# Patient Record
Sex: Female | Born: 1945 | Race: White | Hispanic: No | Marital: Married | Smoking: Former smoker
Health system: Southern US, Community
[De-identification: ages and names within clinical notes are randomized; demographics above are authoritative.]

## PROBLEM LIST (undated history)

## (undated) DIAGNOSIS — C4491 Basal cell carcinoma of skin, unspecified: Secondary | ICD-10-CM

## (undated) DIAGNOSIS — R519 Headache, unspecified: Secondary | ICD-10-CM

## (undated) DIAGNOSIS — R51 Headache: Secondary | ICD-10-CM

## (undated) DIAGNOSIS — K589 Irritable bowel syndrome without diarrhea: Secondary | ICD-10-CM

## (undated) DIAGNOSIS — K635 Polyp of colon: Secondary | ICD-10-CM

## (undated) DIAGNOSIS — T7840XA Allergy, unspecified, initial encounter: Secondary | ICD-10-CM

## (undated) DIAGNOSIS — C4492 Squamous cell carcinoma of skin, unspecified: Secondary | ICD-10-CM

## (undated) DIAGNOSIS — M1990 Unspecified osteoarthritis, unspecified site: Secondary | ICD-10-CM

## (undated) HISTORY — DX: Headache, unspecified: R51.9

## (undated) HISTORY — PX: CATARACT EXTRACTION: SUR2

## (undated) HISTORY — DX: Unspecified osteoarthritis, unspecified site: M19.90

## (undated) HISTORY — DX: Squamous cell carcinoma of skin, unspecified: C44.92

## (undated) HISTORY — DX: Headache: R51

## (undated) HISTORY — DX: Irritable bowel syndrome, unspecified: K58.9

## (undated) HISTORY — DX: Allergy, unspecified, initial encounter: T78.40XA

## (undated) HISTORY — PX: SKIN BIOPSY: SHX1

## (undated) HISTORY — DX: Basal cell carcinoma of skin, unspecified: C44.91

## (undated) HISTORY — DX: Polyp of colon: K63.5

---

## 1975-10-31 HISTORY — PX: TONSILLECTOMY AND ADENOIDECTOMY: SHX28

## 2010-10-30 HISTORY — PX: COLONOSCOPY: SHX174

## 2013-04-23 ENCOUNTER — Ambulatory Visit: Payer: Self-pay | Admitting: Internal Medicine

## 2014-03-27 ENCOUNTER — Ambulatory Visit: Payer: Self-pay | Admitting: Internal Medicine

## 2014-04-03 ENCOUNTER — Ambulatory Visit (INDEPENDENT_AMBULATORY_CARE_PROVIDER_SITE_OTHER): Payer: Medicare Other | Admitting: Internal Medicine

## 2014-04-03 ENCOUNTER — Encounter: Payer: Self-pay | Admitting: Internal Medicine

## 2014-04-03 VITALS — BP 108/76 | HR 85 | Temp 97.9°F | Ht 63.75 in | Wt 154.0 lb

## 2014-04-03 DIAGNOSIS — Z85828 Personal history of other malignant neoplasm of skin: Secondary | ICD-10-CM

## 2014-04-03 DIAGNOSIS — K589 Irritable bowel syndrome without diarrhea: Secondary | ICD-10-CM

## 2014-04-03 DIAGNOSIS — J302 Other seasonal allergic rhinitis: Secondary | ICD-10-CM | POA: Insufficient documentation

## 2014-04-03 DIAGNOSIS — Z0000 Encounter for general adult medical examination without abnormal findings: Secondary | ICD-10-CM

## 2014-04-03 NOTE — Assessment & Plan Note (Signed)
Mostly constipation Adheres to a high fiber diet

## 2014-04-03 NOTE — Progress Notes (Signed)
HPI:  Pt presents to the clinic today to establish care. She is transferring care from Dr. Keturah Barre office. She has no concerns other than she is due for her annual wellness exam today. She would like referral to another dermatologist. She did go to Dr. Nicole Kindred at [facility] prior.  Past Medical History  Diagnosis Date  . Allergy   . Arthritis   . IBS (irritable bowel syndrome)   . Colon polyps   . Frequent headaches   . Skin cancer, basal cell   . Cancer, skin, squamous cell     Current Outpatient Prescriptions  Medication Sig Dispense Refill  . Ascorbic Acid (VITAMIN C) 1000 MG tablet Take 1,000 mg by mouth daily.      Marland Kitchen aspirin 81 MG tablet Take 81 mg by mouth daily.      . Aspirin-Acetaminophen-Caffeine (EXCEDRIN PO) Take 1 capsule by mouth as needed.      . Biotin 5000 MCG TABS Take 1 tablet by mouth.      . Calcium Carb-Cholecalciferol (CALCIUM 600+D3) 600-800 MG-UNIT TABS Take 1 capsule by mouth daily.      . Coconut Oil 1000 MG CAPS Take 1 capsule by mouth daily.      . Inulin (FIBER CHOICE PO) Take 1 each by mouth as needed.      . loratadine (CLARITIN) 10 MG tablet Take 10 mg by mouth daily.      . Multiple Vitamins-Minerals (WOMENS MULTIVITAMIN PLUS PO) Take 1 capsule by mouth daily.      . Omega-3 Fatty Acids (FISH OIL) 1000 MG CAPS Take 1 capsule by mouth daily.      . Probiotic Product (PROBIOTIC DAILY PO) Take 1 capsule by mouth as needed.       No current facility-administered medications for this visit.    No Known Allergies  Family History  Problem Relation Age of Onset  . Arthritis Mother   . Hyperlipidemia Mother   . Hypertension Mother   . Mental illness Sister   . Cancer Maternal Grandmother     Ovarian and Uterine  . Diabetes Maternal Grandmother   . Mental illness Maternal Grandfather     History   Social History  . Marital Status: Married    Spouse Name: N/A    Number of Children: N/A  . Years of Education: N/A   Occupational  History  . Not on file.   Social History Main Topics  . Smoking status: Never Smoker   . Smokeless tobacco: Never Used  . Alcohol Use: 0.6 oz/week    1 Glasses of wine per week     Comment: rare--wine  . Drug Use: No  . Sexual Activity: No   Other Topics Concern  . Not on file   Social History Narrative  . No narrative on file    Hospitiliaztions: None  Health Maintenance:  Flu: 07/2013 Tetanus: 2014 Pneumovax:07/2013 Zostovax: 2013 Pap Smear: 08/2013 Mammogram: 07/2013 Bone Density: 06/2012 Colonoscopy: 2013 (Unsure of her GI doctor's name) Eye Doctor: yearly [facility]) Dentist: biannually (Dr. Daily) Dermatologist: 08/2013 (Dr. [facility])    I have personally reviewed and have noted:  1. The patient's medical and social history 2. Their use of alcohol, tobacco or illicit drugs 3. Their current medications and supplements 4. The patient's functional ability including ADL's, fall  risks, home safety risks and  hearing or visual  impairment. 5. Diet and physical activities 6. Evidence for depression or mood disorder  Subjective:  Review of Systems:   Constitutional: Pt reports sinus headache. Denies fever, malaise, fatigue, or abrupt weight changes.  HEENT: Pt reports nasal congestion. Denies eye pain, eye redness, ear pain, ringing in the ears, wax buildup, runny nose, bloody nose, or sore throat. Respiratory: Denies difficulty breathing, shortness of breath, cough or sputum production.   Cardiovascular: Denies chest pain, chest tightness, palpitations or swelling in the hands or feet.  Gastrointestinal: Pt reports constipation. Denies abdominal pain, bloating, diarrhea or blood in the stool.  GU: Denies urgency, frequency, pain with urination, burning sensation, blood in urine, odor or discharge. Musculoskeletal: Denies decrease in range of motion, difficulty with gait, muscle pain or joint pain and swelling.  Skin: Denies  redness, rashes, lesions or ulcercations.  Neurological: Denies dizziness, difficulty with memory, difficulty with speech or problems with balance and coordination.   No other specific complaints in a complete review of systems (except as listed in HPI above).  Objective:  PE:   BP 108/76  Pulse 85  Temp(Src) 97.9 F (36.6 C) (Oral)  Ht 5' 3.75" (1.619 m)  Wt 154 lb (69.854 kg)  BMI 26.65 kg/m2  SpO2 97% Wt Readings from Last 3 Encounters:  04/03/14 154 lb (69.854 kg)    General: Appears her stated age, well developed, well nourished in NAD. HEENT: Head: normal shape and size, maxillary sinus tenderness noted; Eyes: sclera white, no icterus, conjunctiva pink, PERRLA and EOMs intact; Ears: Tm's gray and intact, normal light reflex; Nose: mucosa boggy and moist, septum midline; Throat/Mouth: Teeth present, mucosa pink and moist, + PND, no exudate, lesions or ulcerations noted.   Cardiovascular: Normal rate and rhythm. S1,S2 noted.  No murmur, rubs or gallops noted. No JVD or BLE edema. No carotid bruits noted. Pulmonary/Chest: Normal effort and positive vesicular breath sounds. No respiratory distress. No wheezes, rales or ronchi noted.  Abdomen: Soft and nontender. Normal bowel sounds, no bruits noted. No distention or masses noted. Liver, spleen and kidneys non palpable.   Assessment and Plan:   Medicare Annual Wellness Visit:  Diet: Heart healthy- eats lots of fruits and veggies, high fiber Physical activity: Active 3-5 days per week (walking/gym) Depression/mood screen: Negative Hearing: Intact to whispered voice Visual acuity: Grossly normal, performs annual eye exam  ADLs: Capable Fall risk: None Home safety: Good Cognitive evaluation: Intact to orientation, naming, recall and repetition EOL planning: Living will, full code/ I agree (She will provide [place] with a copy of the living will)  Preventative Medicine:  Pap Smear: Due 2019 Mammogram: Due 08/2014 Bone Density:  Due 2018 Colonoscopy: Due 2020 Eye Doctor:  Scheduled 04/13/14 Dentist:  Scheduled 04/13/2014 Dermatologist: Due 08/2014  Next appointment: 08/2014 with me for annual physical and blood work

## 2014-04-03 NOTE — Assessment & Plan Note (Signed)
Will refer to derm per pt request

## 2014-04-03 NOTE — Progress Notes (Signed)
Pre visit review using our clinic review tool, if applicable. No additional management support is needed unless otherwise documented below in the visit note. 

## 2014-04-03 NOTE — Assessment & Plan Note (Signed)
On claritin daily Advised adding Nasonex

## 2014-04-03 NOTE — Patient Instructions (Addendum)
Pap Smear: Due 2019 Mammogram: 08/2014 Bone Density: Due 2018 Colonoscopy: Due 2020 Eye Doctor: 04/13/14 Dentist: 03/2014 Dermatologist: Due 08/2014

## 2014-09-14 ENCOUNTER — Ambulatory Visit (INDEPENDENT_AMBULATORY_CARE_PROVIDER_SITE_OTHER): Payer: Medicare Other | Admitting: Internal Medicine

## 2014-09-14 ENCOUNTER — Encounter: Payer: Self-pay | Admitting: Internal Medicine

## 2014-09-14 VITALS — BP 102/60 | HR 69 | Temp 97.9°F | Ht 63.75 in | Wt 157.0 lb

## 2014-09-14 DIAGNOSIS — Z0000 Encounter for general adult medical examination without abnormal findings: Secondary | ICD-10-CM

## 2014-09-14 DIAGNOSIS — Z13820 Encounter for screening for osteoporosis: Secondary | ICD-10-CM

## 2014-09-14 DIAGNOSIS — E2839 Other primary ovarian failure: Secondary | ICD-10-CM | POA: Diagnosis not present

## 2014-09-14 DIAGNOSIS — Z79899 Other long term (current) drug therapy: Secondary | ICD-10-CM | POA: Diagnosis not present

## 2014-09-14 LAB — COMPREHENSIVE METABOLIC PANEL
ALBUMIN: 4.5 g/dL (ref 3.5–5.2)
ALK PHOS: 61 U/L (ref 39–117)
ALT: 18 U/L (ref 0–35)
AST: 23 U/L (ref 0–37)
BILIRUBIN TOTAL: 0.7 mg/dL (ref 0.2–1.2)
BUN: 17 mg/dL (ref 6–23)
CO2: 24 mEq/L (ref 19–32)
Calcium: 9.5 mg/dL (ref 8.4–10.5)
Chloride: 108 mEq/L (ref 96–112)
Creatinine, Ser: 0.8 mg/dL (ref 0.4–1.2)
GFR: 75.75 mL/min (ref >60.00)
GLUCOSE: 94 mg/dL (ref 70–99)
POTASSIUM: 4.2 meq/L (ref 3.5–5.1)
SODIUM: 142 meq/L (ref 135–145)
TOTAL PROTEIN: 7 g/dL (ref 6.0–8.3)

## 2014-09-14 LAB — LIPID PANEL
CHOLESTEROL: 177 mg/dL (ref 0–200)
HDL: 51.5 mg/dL (ref >39.00)
LDL CALC: 113 mg/dL — AB (ref 0–99)
NonHDL: 125.5
Total CHOL/HDL Ratio: 3
Triglycerides: 62 mg/dL (ref 0.0–149.0)
VLDL: 12.4 mg/dL (ref 0.0–40.0)

## 2014-09-14 LAB — CBC
HCT: 41 % (ref 36.0–46.0)
Hemoglobin: 13.7 g/dL (ref 12.0–15.0)
MCHC: 33.4 g/dL (ref 30.0–36.0)
MCV: 90.5 fl (ref 78.0–100.0)
Platelets: 248 10*3/uL (ref 150.0–400.0)
RBC: 4.53 Mil/uL (ref 3.87–5.11)
RDW: 13.1 % (ref 11.5–15.5)
WBC: 5.6 10*3/uL (ref 4.0–10.5)

## 2014-09-14 LAB — VITAMIN D 25 HYDROXY (VIT D DEFICIENCY, FRACTURES): VITD: 39.23 ng/mL (ref 30.00–100.00)

## 2014-09-14 LAB — TSH: TSH: 1.89 u[IU]/mL (ref 0.35–4.50)

## 2014-09-14 NOTE — Patient Instructions (Signed)

## 2014-09-14 NOTE — Progress Notes (Addendum)
Subjective:    Patient ID: Kristine Perry, female    DOB: 1946/07/24, 68 y.o.   MRN: [id number]  HPI  Pt presents to the clinic today for her annual exam. She had her medicare wellness visit 03/2014.  Flu: yearly, will get this week at the pharmacy Tetanus: 2014 Pneumonia Vaccine: 2013 Prevnar Zostovax: 2013 Mammogram: 07/2013 Pap Smear: 2014 Bone Density: 2013 Colon Screening: Due 2020 Vision Screening: Yearly Dentist: 03/2014  Review of Systems  Past Medical History  Diagnosis Date  . Allergy   . Arthritis   . IBS (irritable bowel syndrome)   . Colon polyps   . Frequent headaches   . Skin cancer, basal cell   . Cancer, skin, squamous cell     Current Outpatient Prescriptions  Medication Sig Dispense Refill  . Ascorbic Acid (VITAMIN C) 1000 MG tablet Take 1,000 mg by mouth daily.    Marland Kitchen aspirin 81 MG tablet Take 81 mg by mouth daily.    . Aspirin-Acetaminophen-Caffeine (EXCEDRIN PO) Take 1 capsule by mouth as needed.    . Biotin 5000 MCG TABS Take 1 tablet by mouth.    . Calcium Carb-Cholecalciferol (CALCIUM 600+D3) 600-800 MG-UNIT TABS Take 1 capsule by mouth daily.    . Coconut Oil 1000 MG CAPS Take 1 capsule by mouth daily.    . Inulin (FIBER CHOICE PO) Take 1 each by mouth as needed.    . loratadine (CLARITIN) 10 MG tablet Take 10 mg by mouth daily.    . Multiple Vitamins-Minerals (WOMENS MULTIVITAMIN PLUS PO) Take 1 capsule by mouth daily.    . Omega-3 Fatty Acids (FISH OIL) 1000 MG CAPS Take 1 capsule by mouth daily.    . Probiotic Product (PROBIOTIC DAILY PO) Take 1 capsule by mouth as needed.     No current facility-administered medications for this visit.    No Known Allergies  Family History  Problem Relation Age of Onset  . Arthritis Mother   . Hyperlipidemia Mother   . Hypertension Mother   . Mental illness Sister   . Cancer Maternal Grandmother     Ovarian and Uterine  . Diabetes Maternal Grandmother   . Mental illness Maternal Grandfather      History   Social History  . Marital Status: Married    Spouse Name: N/A    Number of Children: N/A  . Years of Education: N/A   Occupational History  . Not on file.   Social History Main Topics  . Smoking status: Never Smoker   . Smokeless tobacco: Never Used  . Alcohol Use: 0.6 oz/week    1 Glasses of wine per week     Comment: rare--wine  . Drug Use: No  . Sexual Activity: No   Other Topics Concern  . Not on file   Social History Narrative     Constitutional: Pt reports weight gain. Denies fever, malaise, fatigue, headache.  HEENT: Pt reports dry eyes. Denies eye pain, eye redness, ear pain, ringing in the ears, wax buildup, runny nose, nasal congestion, bloody nose, or sore throat. Respiratory: Denies difficulty breathing, shortness of breath, cough or sputum production.   Cardiovascular: Denies chest pain, chest tightness, palpitations or swelling in the hands or feet.  Gastrointestinal: Pt reports flatulence. Denies abdominal pain, bloating, constipation, diarrhea or blood in the stool.  GU: Denies urgency, frequency, pain with urination, burning sensation, blood in urine, odor or discharge. Musculoskeletal: Pt reports joint pain and swelling. Denies decrease in range of motion, difficulty with  gait, muscle pain.  Skin: Pt reports dry skin. Denies redness, rashes, lesions or ulcercations.  Neurological: Denies dizziness, difficulty with memory, difficulty with speech or problems with balance and coordination.   No other specific complaints in a complete review of systems (except as listed in HPI above).     Objective:   Physical Exam   BP 102/60 mmHg  Pulse 69  Temp(Src) 97.9 F (36.6 C) (Oral)  Ht 5' 3.75" (1.619 m)  Wt 157 lb (71.215 kg)  BMI 27.17 kg/m2  SpO2 98% Wt Readings from Last 3 Encounters:  09/14/14 157 lb (71.215 kg)  04/03/14 154 lb (69.854 kg)    General: Appears her stated age, well developed, well nourished in NAD. She has only  gained 3 lbs. Skin: Warm, dry and intact. No rashes, lesions or ulcerations noted. HEENT: Head: normal shape and size; Eyes: sclera white, no icterus, conjunctiva pink, PERRLA and EOMs intact; Ears: Tm's gray and intact, normal light reflex; Nose: mucosa pink and moist, septum midline; Throat/Mouth: Teeth present, mucosa pink and moist, no exudate, lesions or ulcerations noted.  Neck:  Neck supple, trachea midline. No masses, lumps or thyromegaly present.  Cardiovascular: Normal rate and rhythm. S1,S2 noted.  No murmur, rubs or gallops noted. No JVD or BLE edema. No carotid bruits noted. Pulmonary/Chest: Normal effort and positive vesicular breath sounds. No respiratory distress. No wheezes, rales or ronchi noted.  Abdomen: Soft and nontender. Normal bowel sounds, no bruits noted. No distention or masses noted. Liver, spleen and kidneys non palpable. Musculoskeletal: Normal range of motion. Herbdens nodes noted of hands bilaterally. Grip strength 5/5 bilaterally. No difficulty with gait.  Neurological: Alert and oriented. Cranial nerves II-XII grossly intact.  Psychiatric: Mood and affect normal. Behavior is normal. Judgment and thought content normal.        Assessment & Plan:   Preventative Health Maintenance:  Will check CBC, CMET, Lipid profile and TSH today She will call [place] to schedule her mammogram- I gave her the phone number Will order a Bone Denisty for [facility] Will get records from previous PCP to see which pneumonia vaccine she had previously All other HM UTD  RTC in 6 months for annual wellness exam

## 2014-09-14 NOTE — Addendum Note (Signed)
Addended by: Jearld Fenton on: 09/14/2014 08:00 PM   Modules accepted: Miquel Dunn

## 2014-09-14 NOTE — Progress Notes (Signed)
Pre visit review using our clinic review tool, if applicable. No additional management support is needed unless otherwise documented below in the visit note. 

## 2014-09-29 ENCOUNTER — Telehealth: Payer: Self-pay | Admitting: Internal Medicine

## 2014-09-29 NOTE — Telephone Encounter (Signed)
I would try Mucinex and Delsym. If symptoms do not improve she should make an appt to be evaluated

## 2014-09-29 NOTE — Telephone Encounter (Signed)
Pt called and spoke with [place] she has head and chest congestion, non-productive cough. has tried otc med but nothing is working. wants suggestions on what she can do without having to make an appt  Please call pt with advice.

## 2014-09-30 NOTE — Telephone Encounter (Signed)
Left detailed msg on VM per HIPAA  

## 2014-10-28 ENCOUNTER — Ambulatory Visit: Payer: Self-pay | Admitting: Internal Medicine

## 2014-10-28 ENCOUNTER — Encounter: Payer: Self-pay | Admitting: Internal Medicine

## 2014-11-03 ENCOUNTER — Encounter: Payer: Self-pay | Admitting: Internal Medicine

## 2014-11-03 ENCOUNTER — Telehealth: Payer: Self-pay | Admitting: Internal Medicine

## 2014-11-03 NOTE — Telephone Encounter (Signed)
Pt cannot get her my chart to work.

## 2014-11-03 NOTE — Telephone Encounter (Signed)
Pt would like call back with MM and bone density results.  [phone]

## 2014-11-03 NOTE — Telephone Encounter (Signed)
I released the results to her mychart. Both normal.

## 2014-11-04 NOTE — Telephone Encounter (Signed)
Left detailed msg on VM per HIPAA  

## 2014-11-13 ENCOUNTER — Telehealth: Payer: Self-pay | Admitting: *Deleted

## 2014-11-13 NOTE — Telephone Encounter (Signed)
Lm on pts vm requesting a call back if wanting to schedule a flu shot 

## 2015-03-30 ENCOUNTER — Encounter: Payer: Self-pay | Admitting: Internal Medicine

## 2015-03-30 ENCOUNTER — Ambulatory Visit (INDEPENDENT_AMBULATORY_CARE_PROVIDER_SITE_OTHER): Payer: Medicare Other | Admitting: Internal Medicine

## 2015-03-30 ENCOUNTER — Telehealth: Payer: Self-pay | Admitting: Internal Medicine

## 2015-03-30 VITALS — BP 134/92 | HR 88 | Temp 98.4°F | Wt 160.0 lb

## 2015-03-30 DIAGNOSIS — F439 Reaction to severe stress, unspecified: Secondary | ICD-10-CM

## 2015-03-30 DIAGNOSIS — Z658 Other specified problems related to psychosocial circumstances: Secondary | ICD-10-CM

## 2015-03-30 DIAGNOSIS — J309 Allergic rhinitis, unspecified: Secondary | ICD-10-CM | POA: Diagnosis not present

## 2015-03-30 DIAGNOSIS — R079 Chest pain, unspecified: Secondary | ICD-10-CM | POA: Diagnosis not present

## 2015-03-30 NOTE — Progress Notes (Signed)
Subjective:    Patient ID: Kristine Perry, female    DOB: 09-Mar-1946, 69 y.o.   MRN: [id number]  HPI  Pt presents to the clinic today with c/o multiple complaints.   1- She has been having headache, facial pressure, nasal congestion and cough. This has been intermittent over the last month. She is not blowing anything out of her nose. He cough is no productive. She denies shortness of breath, fever, chills or body aches. She has taken Claritin and Sudafed intermittently during this time. She does not think it has been too effective.  2- She has been having chest pressure, intermittently over the last 2-3 weeks. She reports it feels like a burning sensation. It seems worse first thing in the morning and late in the afternoons. There isn't anything that has made it better or worse, including foods. She denies chest pain, shortness of breath, heartburn. She has had some gas and bloating but reports she is having normal bowel movements. She denies blood in her stool. She has taken TUMS with some relief. She is a little concerned that it may be stress related . She has been under a lot of stress lately. Her family is out of town and she is very concerned about their health.  3- Her BP is elevated today at 134/92. She has no history of elevated blood pressure. She reports she is very anxious right now because when she called to make the appointment, they advised her to go to the ER.  Review of Systems      Past Medical History  Diagnosis Date  . Allergy   . Arthritis   . IBS (irritable bowel syndrome)   . Colon polyps   . Frequent headaches   . Skin cancer, basal cell   . Cancer, skin, squamous cell     Current Outpatient Prescriptions  Medication Sig Dispense Refill  . aspirin 81 MG tablet Take 81 mg by mouth daily.    . Aspirin-Acetaminophen-Caffeine (EXCEDRIN PO) Take 1 capsule by mouth as needed.    . loratadine (CLARITIN) 10 MG tablet Take 10 mg by mouth daily.    . Ascorbic Acid  (VITAMIN C) 1000 MG tablet Take 1,000 mg by mouth daily.    . Biotin 5000 MCG TABS Take 1 tablet by mouth.    . Calcium Carb-Cholecalciferol (CALCIUM 600+D3) 600-800 MG-UNIT TABS Take 1 capsule by mouth daily.    . Coconut Oil 1000 MG CAPS Take 1 capsule by mouth daily.    . Multiple Vitamins-Minerals (WOMENS MULTIVITAMIN PLUS PO) Take 1 capsule by mouth daily.    . Omega-3 Fatty Acids (FISH OIL) 1000 MG CAPS Take 1 capsule by mouth daily.     No current facility-administered medications for this visit.    Allergies  Allergen Reactions  . Meloxicam Nausea Only    Family History  Problem Relation Age of Onset  . Arthritis Mother   . Hyperlipidemia Mother   . Hypertension Mother   . Mental illness Sister   . Cancer Maternal Grandmother     Ovarian and Uterine  . Diabetes Maternal Grandmother   . Mental illness Maternal Grandfather     History   Social History  . Marital Status: Married    Spouse Name: N/A  . Number of Children: N/A  . Years of Education: N/A   Occupational History  . Not on file.   Social History Main Topics  . Smoking status: Never Smoker   . Smokeless tobacco: Never Used  .  Alcohol Use: 0.6 oz/week    1 Glasses of wine per week     Comment: rare--wine  . Drug Use: No  . Sexual Activity: No   Other Topics Concern  . Not on file   Social History Narrative     Constitutional: Pt reports headache. Denies fever, malaise, fatigue, or abrupt weight changes.  HEENT: Pt reports nasal congestion. Denies eye pain, eye redness, ear pain, ringing in the ears, wax buildup, runny nose, bloody nose, or sore throat. Respiratory: Pt reports cough. Denies difficulty breathing, shortness of breath or sputum production.   Cardiovascular: Pt reports chest pain. Denies chest tightness, palpitations or swelling in the hands or feet.  Gastrointestinal: Pt reports gas and bloating. Denies abdominal pain, constipation, diarrhea or blood in the stool.  GU: Denies  urgency, frequency, pain with urination, burning sensation, blood in urine, odor or discharge.  No other specific complaints in a complete review of systems (except as listed in HPI above).  Objective:   Physical Exam    BP 134/92 mmHg  Pulse 88  Temp(Src) 98.4 F (36.9 C) (Oral)  Wt 160 lb (72.576 kg)  SpO2 98% Wt Readings from Last 3 Encounters:  03/30/15 160 lb (72.576 kg)  09/14/14 157 lb (71.215 kg)  04/03/14 154 lb (69.854 kg)    General: Appears her stated age, well developed, well nourished in NAD. HEENT: Head: normal shape and size; Eyes: sclera white, no icterus, conjunctiva pink, PERRLA and EOMs intact, no sinus tenderness notedt; Ears: Tm's gray and intact, normal light reflex; Throat/Mouth: Teeth present, + PND, mucosa pink and moist, no exudate, lesions or ulcerations noted.  Neck: No adenopathy noted.  Cardiovascular: Tachycardic with normal rhythm. S1,S2 noted.  No murmur, rubs or gallops noted.No carotid bruits noted. Pulmonary/Chest: Normal effort and positive vesicular breath sounds. No respiratory distress. No wheezes, rales or ronchi noted.  Abdomen: Soft and nontender. Normal bowel sounds, no bruits noted. No distention or masses noted. Liver, spleen and kidneys non palpable. Neurological: Alert and oriented.  Psychiatric: She seems very anxious today.  BMET    Component Value Date/Time   NA 142 09/14/2014 0854   K 4.2 09/14/2014 0854   CL 108 09/14/2014 0854   CO2 24 09/14/2014 0854   GLUCOSE 94 09/14/2014 0854   BUN 17 09/14/2014 0854   CREATININE 0.8 09/14/2014 0854   CALCIUM 9.5 09/14/2014 0854    Lipid Panel     Component Value Date/Time   CHOL 177 09/14/2014 0854   TRIG 62.0 09/14/2014 0854   HDL 51.50 09/14/2014 0854   CHOLHDL 3 09/14/2014 0854   VLDL 12.4 09/14/2014 0854   LDLCALC 113* 09/14/2014 0854    CBC    Component Value Date/Time   WBC 5.6 09/14/2014 0854   RBC 4.53 09/14/2014 0854   HGB 13.7 09/14/2014 0854   HCT 41.0  09/14/2014 0854   PLT 248.0 09/14/2014 0854   MCV 90.5 09/14/2014 0854   MCHC 33.4 09/14/2014 0854   RDW 13.1 09/14/2014 0854    Hgb A1C No results found for: HGBA1C     Assessment & Plan:   Chest pain, unspecified:  ECG: left atrial enlargement, otherwise normal Will try Prilosec 20 mg PO daily x 2 weeks She will update me in 2 weeks on how she is doing, sooner if worse OK to continue Tums as needed  Allergic Rhinitis:  Switch from Claritin to Allegra or Zyrtec  Stress:  She is not interested in treatment with therapy or  medication at this time Discussed stress relieving techniques such as deep breathing and meditation  RTC as needed or if symptoms persist or worsen

## 2015-03-30 NOTE — Progress Notes (Signed)
Pre visit review using our clinic review tool, if applicable. No additional management support is needed unless otherwise documented below in the visit note. 

## 2015-03-30 NOTE — Telephone Encounter (Signed)
TH called [place] to let us know that had a call 911 disposition for chest pain.  They reinforced the importance of calling, pt declined each time.  Please call pt to advise further. Thanks.

## 2015-03-30 NOTE — Patient Instructions (Addendum)
ECG is normal- this is likely not your heart It could be stress related, but we will hold off on any treatment for this It could also be reflux, start taking Prilosec 20 mg (this is over the counter) daily x 2 weeks Update me in 2 weeks and let me know how you are doing  Chest Pain (Nonspecific) It is often hard to give a specific diagnosis for the cause of chest pain. There is always a chance that your pain could be related to something serious, such as a heart attack or a blood clot in the lungs. You need to follow up with your health care provider for further evaluation. CAUSES   Heartburn.  Pneumonia or bronchitis.  Anxiety or stress.  Inflammation around your heart (pericarditis) or lung (pleuritis or pleurisy).  A blood clot in the lung.  A collapsed lung (pneumothorax). It can develop suddenly on its own (spontaneous pneumothorax) or from trauma to the chest.  Shingles infection (herpes zoster virus). The chest wall is composed of bones, muscles, and cartilage. Any of these can be the source of the pain.  The bones can be bruised by injury.  The muscles or cartilage can be strained by coughing or overwork.  The cartilage can be affected by inflammation and become sore (costochondritis). DIAGNOSIS  Lab tests or other studies may be needed to find the cause of your pain. Your health care provider may have you take a test called an ambulatory electrocardiogram (ECG). An ECG records your heartbeat patterns over a 24-hour period. You may also have other tests, such as:  Transthoracic echocardiogram (TTE). During echocardiography, sound waves are used to evaluate how blood flows through your heart.  Transesophageal echocardiogram (TEE).  Cardiac monitoring. This allows your health care provider to monitor your heart rate and rhythm in real time.  Holter monitor. This is a portable device that records your heartbeat and can help diagnose heart arrhythmias. It allows your health  care provider to track your heart activity for several days, if needed.  Stress tests by exercise or by giving medicine that makes the heart beat faster. TREATMENT   Treatment depends on what may be causing your chest pain. Treatment may include:  Acid blockers for heartburn.  Anti-inflammatory medicine.  Pain medicine for inflammatory conditions.  Antibiotics if an infection is present.  You may be advised to change lifestyle habits. This includes stopping smoking and avoiding alcohol, caffeine, and chocolate.  You may be advised to keep your head raised (elevated) when sleeping. This reduces the chance of acid going backward from your stomach into your esophagus. Most of the time, nonspecific chest pain will improve within 2-3 days with rest and mild pain medicine.  HOME CARE INSTRUCTIONS   If antibiotics were prescribed, take them as directed. Finish them even if you start to feel better.  For the next few days, avoid physical activities that bring on chest pain. Continue physical activities as directed.  Do not use any tobacco products, including cigarettes, chewing tobacco, or electronic cigarettes.  Avoid drinking alcohol.  Only take medicine as directed by your health care provider.  Follow your health care provider's suggestions for further testing if your chest pain does not go away.  Keep any follow-up appointments you made. If you do not go to an appointment, you could develop lasting (chronic) problems with pain. If there is any problem keeping an appointment, call to reschedule. SEEK MEDICAL CARE IF:   Your chest pain does not go away,  even after treatment.  You have a rash with blisters on your chest.  You have a fever. SEEK IMMEDIATE MEDICAL CARE IF:   You have increased chest pain or pain that spreads to your arm, neck, jaw, back, or abdomen.  You have shortness of breath.  You have an increasing cough, or you cough up blood.  You have severe back or  abdominal pain.  You feel nauseous or vomit.  You have severe weakness.  You faint.  You have chills. This is an emergency. Do not wait to see if the pain will go away. Get medical help at once. Call your local emergency services (911 in [place] Do not drive yourself to the hospital. MAKE SURE YOU:   Understand these instructions.  Will watch your condition.  Will get help right away if you are not doing well or get worse. Document Released: 07/26/2005 Document Revised: 10/21/2013 Document Reviewed: 05/21/2008 [facility] Patient Information 2015 [place], [place]. This information is not intended to replace advice given to you by your health care provider. Make sure you discuss any questions you have with your health care provider.

## 2015-03-30 NOTE — Telephone Encounter (Signed)
Patient Name: CHERISH RUNDE DOB: [birth year] Initial Comment Caller states she has headache and chest heaviness Nurse Assessment Nurse: Vallery Sa, RN, Cathy Date/Time (Eastern Time): 03/30/2015 10:03:14 AM Confirm and document reason for call. If symptomatic, describe symptoms. ---Caller states she developed chest heaviness about a month ago (it woke her up this morning and was a 7 on the 1 to 10 scale). She developed another sinus headache this morning. No severe breathing difficulty. No injury in the past 3 days. No fever. Has the patient traveled out of the country within the last 30 days? ---No Does the patient require triage? ---Yes Related visit to physician within the last 2 weeks? ---No Does the PT have any chronic conditions? (i.e. diabetes, asthma, etc.) ---Yes List chronic conditions. ---Sinus infections in the past, Irritable Bowel Syndrome Guidelines Guideline Title Affirmed Question Affirmed Notes Chest Pain [1] Chest pain lasts > 5 minutes AND [2] described as crushing, pressure-like, or heavy pressure-like Final Disposition User Call EMS 911 Now [place], RN, [employer] declined the Call 911 disposition. Reinforced the Call 911 disposition. Called the office backline and Melissa notified. She will notify MD.

## 2015-03-30 NOTE — Telephone Encounter (Signed)
Spoke with pt; had chest heaviness and h/a on and off for one month; now her h/a is gone and chest heaviness is slight; pt said she feels anxious since advised to go to ED but pt wants to be seen in office. No dizziness, h/a,or SOB now. Pt scheduled appt today at 11:15 am to see Webb Silversmith NP.

## 2015-09-08 ENCOUNTER — Other Ambulatory Visit: Payer: Self-pay | Admitting: Internal Medicine

## 2015-09-08 DIAGNOSIS — Z0000 Encounter for general adult medical examination without abnormal findings: Secondary | ICD-10-CM

## 2015-09-17 ENCOUNTER — Other Ambulatory Visit (INDEPENDENT_AMBULATORY_CARE_PROVIDER_SITE_OTHER): Payer: Medicare Other

## 2015-09-17 DIAGNOSIS — Z0000 Encounter for general adult medical examination without abnormal findings: Secondary | ICD-10-CM

## 2015-09-17 LAB — LIPID PANEL
Cholesterol: 209 mg/dL — ABNORMAL HIGH (ref 0–200)
HDL: 51.3 mg/dL (ref >39.00)
LDL Cholesterol: 129 mg/dL — ABNORMAL HIGH (ref 0–99)
NONHDL: 157.24
Total CHOL/HDL Ratio: 4
Triglycerides: 140 mg/dL (ref 0.0–149.0)
VLDL: 28 mg/dL (ref 0.0–40.0)

## 2015-09-17 LAB — CBC
HCT: 40.5 % (ref 36.0–46.0)
Hemoglobin: 13.5 g/dL (ref 12.0–15.0)
MCHC: 33.3 g/dL (ref 30.0–36.0)
MCV: 91.1 fl (ref 78.0–100.0)
PLATELETS: 248 10*3/uL (ref 150.0–400.0)
RBC: 4.44 Mil/uL (ref 3.87–5.11)
RDW: 13.3 % (ref 11.5–15.5)
WBC: 6 10*3/uL (ref 4.0–10.5)

## 2015-09-17 LAB — COMPREHENSIVE METABOLIC PANEL
ALK PHOS: 67 U/L (ref 39–117)
ALT: 22 U/L (ref 0–35)
AST: 17 U/L (ref 0–37)
Albumin: 4.2 g/dL (ref 3.5–5.2)
BUN: 13 mg/dL (ref 6–23)
CO2: 27 mEq/L (ref 19–32)
Calcium: 9.2 mg/dL (ref 8.4–10.5)
Chloride: 107 mEq/L (ref 96–112)
Creatinine, Ser: 0.71 mg/dL (ref 0.40–1.20)
GFR: 86.68 mL/min (ref >60.00)
Glucose, Bld: 97 mg/dL (ref 70–99)
Potassium: 4.4 mEq/L (ref 3.5–5.1)
Sodium: 140 mEq/L (ref 135–145)
Total Bilirubin: 0.4 mg/dL (ref 0.2–1.2)
Total Protein: 6.6 g/dL (ref 6.0–8.3)

## 2015-09-17 LAB — VITAMIN D 25 HYDROXY (VIT D DEFICIENCY, FRACTURES): VITD: 27.15 ng/mL — AB (ref 30.00–100.00)

## 2015-09-20 ENCOUNTER — Ambulatory Visit (INDEPENDENT_AMBULATORY_CARE_PROVIDER_SITE_OTHER): Payer: Medicare Other | Admitting: Internal Medicine

## 2015-09-20 ENCOUNTER — Encounter: Payer: Self-pay | Admitting: Internal Medicine

## 2015-09-20 ENCOUNTER — Other Ambulatory Visit: Payer: Self-pay | Admitting: Internal Medicine

## 2015-09-20 VITALS — BP 124/78 | HR 84 | Temp 98.3°F | Ht 63.75 in | Wt 164.0 lb

## 2015-09-20 DIAGNOSIS — B373 Candidiasis of vulva and vagina: Secondary | ICD-10-CM | POA: Diagnosis not present

## 2015-09-20 DIAGNOSIS — K5900 Constipation, unspecified: Secondary | ICD-10-CM | POA: Diagnosis not present

## 2015-09-20 DIAGNOSIS — R140 Abdominal distension (gaseous): Secondary | ICD-10-CM | POA: Diagnosis not present

## 2015-09-20 DIAGNOSIS — Z0000 Encounter for general adult medical examination without abnormal findings: Secondary | ICD-10-CM

## 2015-09-20 DIAGNOSIS — H9190 Unspecified hearing loss, unspecified ear: Secondary | ICD-10-CM | POA: Diagnosis not present

## 2015-09-20 DIAGNOSIS — B3731 Acute candidiasis of vulva and vagina: Secondary | ICD-10-CM

## 2015-09-20 DIAGNOSIS — Z1231 Encounter for screening mammogram for malignant neoplasm of breast: Secondary | ICD-10-CM

## 2015-09-20 MED ORDER — FLUCONAZOLE 150 MG PO TABS: 150.0000 mg | ORAL_TABLET | Freq: Once | ORAL | Status: DC

## 2015-09-20 NOTE — Progress Notes (Signed)
Pre visit review using our clinic review tool, if applicable. No additional management support is needed unless otherwise documented below in the visit note. 

## 2015-09-20 NOTE — Patient Instructions (Signed)
Health Maintenance, Female Adopting a healthy lifestyle and getting preventive care can go a long way to promote health and wellness. Talk with your health care provider about what schedule of regular examinations is right for you. This is a good chance for you to check in with your provider about disease prevention and staying healthy. In between checkups, there are plenty of things you can do on your own. Experts have done a lot of research about which lifestyle changes and preventive measures are most likely to keep you healthy. Ask your health care provider for more information. WEIGHT AND DIET  Eat a healthy diet  Be sure to include plenty of vegetables, fruits, low-fat dairy products, and lean protein.  Do not eat a lot of foods high in solid fats, added sugars, or salt.  Get regular exercise. This is one of the most important things you can do for your health.  Most adults should exercise for at least 150 minutes each week. The exercise should increase your heart rate and make you sweat (moderate-intensity exercise).  Most adults should also do strengthening exercises at least twice a week. This is in addition to the moderate-intensity exercise.  Maintain a healthy weight  Body mass index (BMI) is a measurement that can be used to identify possible weight problems. It estimates body fat based on height and weight. Your health care provider can help determine your BMI and help you achieve or maintain a healthy weight.  For females 20 years of age and older:   A BMI below 18.5 is considered underweight.  A BMI of 18.5 to 24.9 is normal.  A BMI of 25 to 29.9 is considered overweight.  A BMI of 30 and above is considered obese.  Watch levels of cholesterol and blood lipids  You should start having your blood tested for lipids and cholesterol at 69 years of age, then have this test every 5 years.  You may need to have your cholesterol levels checked more often if:  Your lipid  or cholesterol levels are high.  You are older than 69 years of age.  You are at high risk for heart disease.  CANCER SCREENING   Lung Cancer  Lung cancer screening is recommended for adults 55-80 years old who are at high risk for lung cancer because of a history of smoking.  A yearly low-dose CT scan of the lungs is recommended for people who:  Currently smoke.  Have quit within the past 15 years.  Have at least a 30-pack-year history of smoking. A pack year is smoking an average of one pack of cigarettes a day for 1 year.  Yearly screening should continue until it has been 15 years since you quit.  Yearly screening should stop if you develop a health problem that would prevent you from having lung cancer treatment.  Breast Cancer  Practice breast self-awareness. This means understanding how your breasts normally appear and feel.  It also means doing regular breast self-exams. Let your health care provider know about any changes, no matter how small.  If you are in your 20s or 30s, you should have a clinical breast exam (CBE) by a health care provider every 1-3 years as part of a regular health exam.  If you are 40 or older, have a CBE every year. Also consider having a breast X-ray (mammogram) every year.  If you have a family history of breast cancer, talk to your health care provider about genetic screening.  If you   are at high risk for breast cancer, talk to your health care provider about having an MRI and a mammogram every year.  Breast cancer gene (BRCA) assessment is recommended for women who have family members with BRCA-related cancers. BRCA-related cancers include:  Breast.  Ovarian.  Tubal.  Peritoneal cancers.  Results of the assessment will determine the need for genetic counseling and BRCA1 and BRCA2 testing. Cervical Cancer Your health care provider may recommend that you be screened regularly for cancer of the pelvic organs (ovaries, uterus, and  vagina). This screening involves a pelvic examination, including checking for microscopic changes to the surface of your cervix (Pap test). You may be encouraged to have this screening done every 3 years, beginning at age 21.  For women ages 30-65, health care providers may recommend pelvic exams and Pap testing every 3 years, or they may recommend the Pap and pelvic exam, combined with testing for human papilloma virus (HPV), every 5 years. Some types of HPV increase your risk of cervical cancer. Testing for HPV may also be done on women of any age with unclear Pap test results.  Other health care providers may not recommend any screening for nonpregnant women who are considered low risk for pelvic cancer and who do not have symptoms. Ask your health care provider if a screening pelvic exam is right for you.  If you have had past treatment for cervical cancer or a condition that could lead to cancer, you need Pap tests and screening for cancer for at least 20 years after your treatment. If Pap tests have been discontinued, your risk factors (such as having a new sexual partner) need to be reassessed to determine if screening should resume. Some women have medical problems that increase the chance of getting cervical cancer. In these cases, your health care provider may recommend more frequent screening and Pap tests. Colorectal Cancer  This type of cancer can be detected and often prevented.  Routine colorectal cancer screening usually begins at 69 years of age and continues through 69 years of age.  Your health care provider may recommend screening at an earlier age if you have risk factors for colon cancer.  Your health care provider may also recommend using home test kits to check for hidden blood in the stool.  A small camera at the end of a tube can be used to examine your colon directly (sigmoidoscopy or colonoscopy). This is done to check for the earliest forms of colorectal  cancer.  Routine screening usually begins at age 50.  Direct examination of the colon should be repeated every 5-10 years through 69 years of age. However, you may need to be screened more often if early forms of precancerous polyps or small growths are found. Skin Cancer  Check your skin from head to toe regularly.  Tell your health care provider about any new moles or changes in moles, especially if there is a change in a mole's shape or color.  Also tell your health care provider if you have a mole that is larger than the size of a pencil eraser.  Always use sunscreen. Apply sunscreen liberally and repeatedly throughout the day.  Protect yourself by wearing long sleeves, pants, a wide-brimmed hat, and sunglasses whenever you are outside. HEART DISEASE, DIABETES, AND HIGH BLOOD PRESSURE   High blood pressure causes heart disease and increases the risk of stroke. High blood pressure is more likely to develop in:  People who have blood pressure in the high end   of the normal range (130-139/85-89 mm Hg).  People who are overweight or obese.  People who are African American.  If you are 38-23 years of age, have your blood pressure checked every 3-5 years. If you are 61 years of age or older, have your blood pressure checked every year. You should have your blood pressure measured twice--once when you are at a hospital or clinic, and once when you are not at a hospital or clinic. Record the average of the two measurements. To check your blood pressure when you are not at a hospital or clinic, you can use:  An automated blood pressure machine at a pharmacy.  A home blood pressure monitor.  If you are between 45 years and 39 years old, ask your health care provider if you should take aspirin to prevent strokes.  Have regular diabetes screenings. This involves taking a blood sample to check your fasting blood sugar level.  If you are at a normal weight and have a low risk for diabetes,  have this test once every three years after 68 years of age.  If you are overweight and have a high risk for diabetes, consider being tested at a younger age or more often. PREVENTING INFECTION  Hepatitis B  If you have a higher risk for hepatitis B, you should be screened for this virus. You are considered at high risk for hepatitis B if:  You were born in a country where hepatitis B is common. Ask your health care provider which countries are considered high risk.  Your parents were born in a high-risk country, and you have not been immunized against hepatitis B (hepatitis B vaccine).  You have HIV or AIDS.  You use needles to inject street drugs.  You live with someone who has hepatitis B.  You have had sex with someone who has hepatitis B.  You get hemodialysis treatment.  You take certain medicines for conditions, including cancer, organ transplantation, and autoimmune conditions. Hepatitis C  Blood testing is recommended for:  Everyone born from [birth year] through 1965.  Anyone with known risk factors for hepatitis C. Sexually transmitted infections (STIs)  You should be screened for sexually transmitted infections (STIs) including gonorrhea and chlamydia if:  You are sexually active and are younger than 69 years of age.  You are older than 69 years of age and your health care provider tells you that you are at risk for this type of infection.  Your sexual activity has changed since you were last screened and you are at an increased risk for chlamydia or gonorrhea. Ask your health care provider if you are at risk.  If you do not have HIV, but are at risk, it may be recommended that you take a prescription medicine daily to prevent HIV infection. This is called pre-exposure prophylaxis (PrEP). You are considered at risk if:  You are sexually active and do not regularly use condoms or know the HIV status of your partner(s).  You take drugs by injection.  You are sexually  active with a partner who has HIV. Talk with your health care provider about whether you are at high risk of being infected with HIV. If you choose to begin PrEP, you should first be tested for HIV. You should then be tested every 3 months for as long as you are taking PrEP.  PREGNANCY   If you are premenopausal and you may become pregnant, ask your health care provider about preconception counseling.  If you may  become pregnant, take 400 to 800 micrograms (mcg) of folic acid every day.  If you want to prevent pregnancy, talk to your health care provider about birth control (contraception). OSTEOPOROSIS AND MENOPAUSE   Osteoporosis is a disease in which the bones lose minerals and strength with aging. This can result in serious bone fractures. Your risk for osteoporosis can be identified using a bone density scan.  If you are 61 years of age or older, or if you are at risk for osteoporosis and fractures, ask your health care provider if you should be screened.  Ask your health care provider whether you should take a calcium or vitamin D supplement to lower your risk for osteoporosis.  Menopause may have certain physical symptoms and risks.  Hormone replacement therapy may reduce some of these symptoms and risks. Talk to your health care provider about whether hormone replacement therapy is right for you.  HOME CARE INSTRUCTIONS   Schedule regular health, dental, and eye exams.  Stay current with your immunizations.   Do not use any tobacco products including cigarettes, chewing tobacco, or electronic cigarettes.  If you are pregnant, do not drink alcohol.  If you are breastfeeding, limit how much and how often you drink alcohol.  Limit alcohol intake to no more than 1 drink per day for nonpregnant women. One drink equals 12 ounces of beer, 5 ounces of wine, or 1 ounces of hard liquor.  Do not use street drugs.  Do not share needles.  Ask your health care provider for help if  you need support or information about quitting drugs.  Tell your health care provider if you often feel depressed.  Tell your health care provider if you have ever been abused or do not feel safe at home.   This information is not intended to replace advice given to you by your health care provider. Make sure you discuss any questions you have with your health care provider.   Document Released: 05/01/2011 Document Revised: 11/06/2014 Document Reviewed: 09/17/2013 [employer]vier Interactive Patient Education [employer].

## 2015-09-20 NOTE — Progress Notes (Signed)
HPI:  Pt presents to the clinic today for her Medicare Wellness Exam. She also has a few concerns she would like to discuss.  Bloating and constipation. This has been going no for years. She has a bowel movement about every other day. She feels like her BM's have decreased in size. She denies blood in her stool. She has tried Probiotics in the past but she feels like they made her gain weight so she stopped. She denies acid reflux.  Frequent vaginal burning and itching. This started 2-3 months ago. It is intermittent. She denies vaginal discharge. She denies urinary complaints. She has tried some vaginal yeast cream OTC with short term relief. She denies using scented soaps or douches. She has changed laundry detergents but denies rashes or itching on other areas of her body. She does not take bubble baths.  Past Medical History  Diagnosis Date  . Allergy   . Arthritis   . IBS (irritable bowel syndrome)   . Colon polyps   . Frequent headaches   . Skin cancer, basal cell   . Cancer, skin, squamous cell     Current Outpatient Prescriptions  Medication Sig Dispense Refill  . Ascorbic Acid (VITAMIN C) 1000 MG tablet Take 1,000 mg by mouth daily.    Marland Kitchen aspirin 81 MG tablet Take 81 mg by mouth daily.    . Aspirin-Acetaminophen-Caffeine (EXCEDRIN PO) Take 1 capsule by mouth as needed.    . Biotin 5000 MCG TABS Take 1 tablet by mouth.    . Calcium Carb-Cholecalciferol (CALCIUM 600+D3) 600-800 MG-UNIT TABS Take 1 capsule by mouth daily.    . Coconut Oil 1000 MG CAPS Take 1 capsule by mouth daily.    Marland Kitchen loratadine (CLARITIN) 10 MG tablet Take 10 mg by mouth daily.    . Multiple Vitamins-Minerals (WOMENS MULTIVITAMIN PLUS PO) Take 1 capsule by mouth daily.    . Omega-3 Fatty Acids (FISH OIL) 1000 MG CAPS Take 1 capsule by mouth daily.     No current facility-administered medications for this visit.    Allergies  Allergen Reactions  . Meloxicam Nausea Only    Family History  Problem  Relation Age of Onset  . Arthritis Mother   . Hyperlipidemia Mother   . Hypertension Mother   . Mental illness Sister   . Cancer Maternal Grandmother     Ovarian and Uterine  . Diabetes Maternal Grandmother   . Mental illness Maternal Grandfather     Social History   Social History  . Marital Status: Married    Spouse Name: N/A  . Number of Children: N/A  . Years of Education: N/A   Occupational History  . Not on file.   Social History Main Topics  . Smoking status: Never Smoker   . Smokeless tobacco: Never Used  . Alcohol Use: 0.6 oz/week    1 Glasses of wine per week     Comment: rare--wine  . Drug Use: No  . Sexual Activity: No   Other Topics Concern  . Not on file   Social History Narrative    Hospitiliaztions: None  Health Maintenance:    Flu: 07/2015, at [place] [facility]  Tetanus: 2014  Prevnar: 2013   Pneumovax: 2014  Zostovax: 2013  Mammogram: 09/2014  Pap Smear: 2014  Bone Density: 09/2014  Colon Screening: Due 2020  Vision Screening: 09/2014, Dr. Marcello Moores in [place]  Dentist: 08/2015, Dr. Duffy Bruce    Providers:   PCP: Webb Silversmith, NP-C  Dermatologist: Dr. Kellie Moor  I have personally reviewed and have noted:  1. The patient's medical and social history 2. Their use of alcohol, tobacco or illicit drugs 3. Their current medications and supplements 4. The patient's functional ability including ADL's, fall risks, home safety  risks and hearing or visual impairment. 5. Diet and physical activities 6. Evidence for depression or mood disorder  Subjective:   Review of Systems:   Constitutional: Denies fever, malaise, fatigue, headache or abrupt weight changes.  HEENT: Denies eye pain, eye redness, ear pain, ringing in the ears, wax buildup, runny nose, nasal congestion, bloody nose, or sore throat. Respiratory: Denies difficulty breathing, shortness of breath, cough or sputum production.   Cardiovascular: Denies chest pain, chest tightness,  palpitations or swelling in the hands or feet.  Gastrointestinal: Pt reports bloating and constipation. Denies abdominal pain, diarrhea or blood in the stool.  GU: Pt reports vaginal itching. Denies urgency, frequency, pain with urination, burning sensation, blood in urine, odor or discharge. Musculoskeletal: Denies decrease in range of motion, difficulty with gait, muscle pain or joint pain and swelling.  Skin: Denies redness, rashes, lesions or ulcercations.  Neurological: Denies dizziness, difficulty with memory, difficulty with speech or problems with balance and coordination.  Psych: Denies anxiety, depression, SI/HI.  No other specific complaints in a complete review of systems (except as listed in HPI above).  Objective:  PE:   BP 124/78 mmHg  Pulse 84  Temp(Src) 98.3 F (36.8 C) (Oral)  Ht 5' 3.75" (1.619 m)  Wt 164 lb (74.39 kg)  BMI 28.38 kg/m2  SpO2 96%  Wt Readings from Last 3 Encounters:  03/30/15 160 lb (72.576 kg)  09/14/14 157 lb (71.215 kg)  04/03/14 154 lb (69.854 kg)    General: Appears her stated age, well developed, well nourished in NAD. HEENT: Head: normal shape and size; Eyes: sclera white, no icterus, conjunctiva pink, PERRLA and EOMs intact;   Cardiovascular: Normal rate and rhythm. S1,S2 noted.  No murmur, rubs or gallops noted. No JVD or BLE edema. No carotid bruits noted. Pulmonary/Chest: Normal effort and positive vesicular breath sounds. No respiratory distress. No wheezes, rales or ronchi noted.  Abdomen: Soft and nontender. Hypoactive bowel sounds. Pelvic: Normal female anatomy. Small amount of white discharge noted at vaginal opening. No signs of irritation. Neurological: Alert and oriented. Cranial nerves II-XII grossly intact. Psychiatric: Mood and affect normal. Behavior is normal. Judgment and thought content normal.    BMET    Component Value Date/Time   NA 140 09/17/2015 0825   K 4.4 09/17/2015 0825   CL 107 09/17/2015 0825   CO2 27  09/17/2015 0825   GLUCOSE 97 09/17/2015 0825   BUN 13 09/17/2015 0825   CREATININE 0.71 09/17/2015 0825   CALCIUM 9.2 09/17/2015 0825    Lipid Panel     Component Value Date/Time   CHOL 209* 09/17/2015 0825   TRIG 140.0 09/17/2015 0825   HDL 51.30 09/17/2015 0825   CHOLHDL 4 09/17/2015 0825   VLDL 28.0 09/17/2015 0825   LDLCALC 129* 09/17/2015 0825    CBC    Component Value Date/Time   WBC 6.0 09/17/2015 0825   RBC 4.44 09/17/2015 0825   HGB 13.5 09/17/2015 0825   HCT 40.5 09/17/2015 0825   PLT 248.0 09/17/2015 0825   MCV 91.1 09/17/2015 0825   MCHC 33.3 09/17/2015 0825   RDW 13.3 09/17/2015 0825    Hgb A1C No results found for: HGBA1C    Assessment and Plan:   Medicare Annual Wellness  Visit:  Diet: Low fat Physical activity: She walks and does water aerobics a few days per week Depression/mood screen: Negative Hearing: Intact to whispered voice, but she does want referral for audiologist because she does feel like her hearing has decreased. Visual acuity: Grossly normal, performs annual eye exam  ADLs: Capable Fall risk: None Home safety: Good Cognitive evaluation: Intact to orientation, naming, recall and repetition EOL planning: No adv directives, full code/ I agree  Preventative Medicine: All immunizations are UTD. Pap Smear due 2017. Mammogram due next month, she will call to schedule. Bone density and colonoscopy UTD. Encouraged her to continue yearly vision screening and dental exams. CBC, CMET, Lipid Profile and Vit D level today.   Next appointment: 6 month   Candida Vaginitis:  Wet prep:  +yeast eRx for Diflucan  Bloating and constipation:  She feels like probiotic makes her gain weight Will try Mirilax every other day Update me in 4 weeks and let me know how you are doing

## 2015-09-20 NOTE — Addendum Note (Signed)
Addended by: Jearld Fenton on: 09/20/2015 12:52 PM   Modules accepted: Orders, SmartSet

## 2015-11-02 ENCOUNTER — Other Ambulatory Visit: Payer: Self-pay | Admitting: Internal Medicine

## 2015-11-02 ENCOUNTER — Ambulatory Visit
Admission: RE | Admit: 2015-11-02 | Discharge: 2015-11-02 | Disposition: A | Discharge status: Home / Self Care | Payer: Medicare Other | Source: Ambulatory Visit | Attending: Internal Medicine | Admitting: Internal Medicine

## 2015-11-02 DIAGNOSIS — Z1231 Encounter for screening mammogram for malignant neoplasm of breast: Secondary | ICD-10-CM | POA: Diagnosis present

## 2016-06-26 ENCOUNTER — Encounter: Payer: Self-pay | Admitting: *Deleted

## 2016-06-28 NOTE — Discharge Instructions (Signed)
INSTRUCTIONS FOLLOWING OCULOPLASTIC SURGERY °Kristine M. FOWLER, MD ° °AFTER YOUR EYE SURGERY, THER ARE MANY THINGS THWIHC YOU, THE PATIENT, CAN DO TO ASSURE THE BEST POSSIBLE RESULT FROM YOUR OPERATION.  THIS SHEET SHOULD BE REFERRED TO WHENEVER QUESTIONS ARISE.  IF THERE ARE ANY QUESTIONS NOT ANSWERED HERE, DO NOT HESITATE TO CALL OUR OFFICE AT [phone] OR [phone].  THERE IS ALWAYS [facility] AVAILABLE TO CALL IF QUESTIONS OR PROBLEMS ARISE. ° °VISION: Your vision may be blurred and out of focus after surgery until you are able to stop using your ointment, swelling resolves and your eye(s) heal. This may take 1 to 2 weeks at the least.  If your vision becomes gradually more dim or dark, this is not normal and you need to call our office immediately. ° °EYE CARE: For the first 48 hours after surgery, use ice packs frequently - “20 minutes on, 20 minutes off” - to help reduce swelling and bruising.  Small bags of frozen peas or corn make good ice packs along with cloths soaked in ice water.  If you are wearing a patch or other type of dressing following surgery, keep this on for the amount of time specified by your doctor.  For the first week following surgery, you will need to treat your stitches with great care.  If is OK to shower, but take care to not allow soapy water to run into your eye(s) to help reduce changes of infection.  You may gently clean the eyelashes and around the eye(s) with cotton balls and sterile water, BUT DO NOT RUB THE STITCHES VIGOROUSLY.  Keeping your stitches moist with ointment will help promote healing with minimal scar formation. ° °ACTIVITY: When you leave the surgery center, you should go home, rest and be inactive.  The eye(s) may feel scratchy and keeping the eyes closed will allow for faster healing.  The first week following surgery, avoid straining (anything making the face turn red) or lifting over 20 pounds.  Additionally, avoid bending which causes your head to go below  your waist.  Using your eyes will NOT harm them, so feel free to read, watch television, use the computer, etc as desired.  Driving depends on each individual, so check with your doctor if you have questions about driving. ° °MEDICATIONS:  You will be given a prescription for an ointment to use 4 times a day on your stitches.  You can use the ointment in your eyes if they feel scratchy or irritated.  If you eyelid(s) don’t close completely when you sleep, put some ointment in your eyes before bedtime. ° °EMERGENCY: If you experience SEVERE EYE PAIN OR HEADACHE UNRELIEVED BY TYLENOL OR PERCOCET, NAUSEA OR VOMITING, WORSENING REDNESS, OR WORSENING VISION (ESPECIALLY VISION THAT WA INITIALLY BETTER) CALL [phone] OR [phone] DURING BUSINESS HOURS OR AFTER HOURS. ° °General Anesthesia, Adult, Care After °Refer to this sheet in the next few weeks. These instructions provide you with information on caring for yourself after your procedure. Your health care provider may also give you more specific instructions. Your treatment has been planned according to current medical practices, but problems sometimes occur. Call your health care provider if you have any problems or questions after your procedure. °WHAT TO EXPECT AFTER THE PROCEDURE °After the procedure, it is typical to experience: °· Sleepiness. °· Nausea and vomiting. °HOME CARE INSTRUCTIONS °· For the first 24 hours after general anesthesia: °¨ Have a responsible person with you. °¨ Do not drive a car. If you   are alone, do not take public transportation. °¨ Do not drink alcohol. °¨ Do not take medicine that has not been prescribed by your health care provider. °¨ Do not sign important papers or make important decisions. °¨ You may resume a normal diet and activities as directed by your health care provider. °· Change bandages (dressings) as directed. °· If you have questions or problems that seem related to general anesthesia, call the hospital and ask for  the anesthetist or anesthesiologist on call. °SEEK MEDICAL CARE IF: °· You have nausea and vomiting that continue the day after anesthesia. °· You develop a rash. °SEEK IMMEDIATE MEDICAL CARE IF:  °· You have difficulty breathing. °· You have chest pain. °· You have any allergic problems. °  °This information is not intended to replace advice given to you by your health care provider. Make sure you discuss any questions you have with your health care provider. °  °Document Released: 01/22/2001 Document Revised: 11/06/2014 Document Reviewed: 02/14/2012 °Elsevier Interactive Patient Education ©2016 [employer] ° °

## 2016-07-04 ENCOUNTER — Encounter
Admission: RE | Disposition: A | Discharge status: Home / Self Care | Payer: Self-pay | Source: Ambulatory Visit | Attending: Ophthalmology

## 2016-07-04 ENCOUNTER — Ambulatory Visit: Payer: Medicare Other | Admitting: Student in an Organized Health Care Education/Training Program

## 2016-07-04 ENCOUNTER — Ambulatory Visit
Admission: RE | Admit: 2016-07-04 | Discharge: 2016-07-04 | Disposition: A | Discharge status: Home / Self Care | Payer: Medicare Other | Source: Ambulatory Visit | Attending: Ophthalmology | Admitting: Ophthalmology

## 2016-07-04 DIAGNOSIS — H02831 Dermatochalasis of right upper eyelid: Secondary | ICD-10-CM | POA: Insufficient documentation

## 2016-07-04 DIAGNOSIS — Z87891 Personal history of nicotine dependence: Secondary | ICD-10-CM | POA: Insufficient documentation

## 2016-07-04 DIAGNOSIS — H02834 Dermatochalasis of left upper eyelid: Secondary | ICD-10-CM | POA: Diagnosis present

## 2016-07-04 HISTORY — PX: BROW LIFT: SHX178

## 2016-07-04 SURGERY — BLEPHAROPLASTY
Anesthesia: Monitor Anesthesia Care | Laterality: Bilateral | Wound class: Clean

## 2016-07-04 MED ORDER — ERYTHROMYCIN 5 MG/GM OP OINT: TOPICAL_OINTMENT | OPHTHALMIC | 3 refills | Status: DC

## 2016-07-04 MED ORDER — PROPOFOL 500 MG/50ML IV EMUL
INTRAVENOUS | Status: DC | PRN
  Administered 2016-07-04: 50 ug/kg/min via INTRAVENOUS

## 2016-07-04 MED ORDER — TETRACAINE HCL 0.5 % OP SOLN
OPHTHALMIC | Status: DC | PRN
  Administered 2016-07-04: 2 [drp] via OPHTHALMIC

## 2016-07-04 MED ORDER — LIDOCAINE-EPINEPHRINE 2 %-1:100000 IJ SOLN
INTRAMUSCULAR | Status: DC | PRN
  Administered 2016-07-04: 3 mL via OPHTHALMIC

## 2016-07-04 MED ORDER — MIDAZOLAM HCL 2 MG/2ML IJ SOLN
INTRAMUSCULAR | Status: DC | PRN
  Administered 2016-07-04: 2 mg via INTRAVENOUS

## 2016-07-04 MED ORDER — ERYTHROMYCIN 5 MG/GM OP OINT
TOPICAL_OINTMENT | OPHTHALMIC | Status: DC | PRN
  Administered 2016-07-04: 1 via OPHTHALMIC

## 2016-07-04 MED ORDER — ACETAMINOPHEN 160 MG/5ML PO SOLN: 325.0000 mg | ORAL | Status: DC | PRN

## 2016-07-04 MED ORDER — ACETAMINOPHEN 325 MG PO TABS: 325.0000 mg | ORAL_TABLET | ORAL | Status: DC | PRN

## 2016-07-04 MED ORDER — ALFENTANIL 500 MCG/ML IJ INJ
INJECTION | INTRAMUSCULAR | Status: DC | PRN
  Administered 2016-07-04: 300 ug via INTRAVENOUS
  Administered 2016-07-04: 200 ug via INTRAVENOUS
  Administered 2016-07-04: 500 ug via INTRAVENOUS

## 2016-07-04 MED ORDER — OXYCODONE-ACETAMINOPHEN 5-325 MG PO TABS: 1.0000 | ORAL_TABLET | ORAL | 0 refills | Status: DC | PRN

## 2016-07-04 MED ORDER — LACTATED RINGERS IV SOLN
INTRAVENOUS | Status: DC
  Administered 2016-07-04: 11:00:00 via INTRAVENOUS

## 2016-07-04 SURGICAL SUPPLY — 35 items
APPLICATOR COTTON TIP WD 3 STR (MISCELLANEOUS) ×2 IMPLANT
BLADE SURG 15 STRL LF DISP TIS (BLADE) ×1 IMPLANT
BLADE SURG 15 STRL SS (BLADE) ×1
CORD BIP STRL DISP 12FT (MISCELLANEOUS) ×2 IMPLANT
DRAPE HEAD BAR (DRAPES) ×2 IMPLANT
GAUZE SPONGE 4X4 12PLY STRL (GAUZE/BANDAGES/DRESSINGS) ×2 IMPLANT
GAUZE SPONGE NON-WVN 2X2 STRL (MISCELLANEOUS) ×10 IMPLANT
GLOVE SURG LX 7.0 MICRO (GLOVE) ×2
GLOVE SURG LX STRL 7.0 MICRO (GLOVE) ×2 IMPLANT
MARKER SKIN XFINE TIP W/RULER (MISCELLANEOUS) ×2 IMPLANT
NEEDLE FILTER BLUNT 18X 1/2SAF (NEEDLE) ×1
NEEDLE FILTER BLUNT 18X1 1/2 (NEEDLE) ×1 IMPLANT
NEEDLE HYPO 30X.5 LL (NEEDLE) ×4 IMPLANT
PACK DRAPE NASAL/ENT (PACKS) ×2 IMPLANT
SOL PREP PVP 2OZ (MISCELLANEOUS) ×2
SOLUTION PREP PVP 2OZ (MISCELLANEOUS) ×1 IMPLANT
SPONGE VERSALON 2X2 STRL (MISCELLANEOUS) ×10
SUT CHROMIC 4-0 (SUTURE)
SUT CHROMIC 4-0 M2 12X2 ARM (SUTURE)
SUT CHROMIC 5 0 P 3 (SUTURE) IMPLANT
SUT ETHILON 4 0 CL P 3 (SUTURE) IMPLANT
SUT MERSILENE 4-0 S-2 (SUTURE) IMPLANT
SUT PLAIN GUT (SUTURE) ×2 IMPLANT
SUT PROLENE 5 0 P 3 (SUTURE) IMPLANT
SUT PROLENE 6 0 P 1 18 (SUTURE) IMPLANT
SUT SILK 4 0 G 3 (SUTURE) IMPLANT
SUT VIC AB 5-0 P-3 18X BRD (SUTURE) IMPLANT
SUT VIC AB 5-0 P3 18 (SUTURE)
SUT VICRYL 6-0  S14 CTD (SUTURE)
SUT VICRYL 6-0 S14 CTD (SUTURE) IMPLANT
SUT VICRYL 7 0 TG140 8 (SUTURE) IMPLANT
SUTURE CHRMC 4-0 M2 12X2 ARM (SUTURE) IMPLANT
SYR 3ML LL SCALE MARK (SYRINGE) ×2 IMPLANT
SYRINGE 10CC LL (SYRINGE) ×2 IMPLANT
WATER STERILE IRR 500ML POUR (IV SOLUTION) ×2 IMPLANT

## 2016-07-04 NOTE — Anesthesia Procedure Notes (Signed)
Procedure Name: MAC Performed by: Shianna Bally Pre-anesthesia Checklist: Patient identified, Emergency Drugs available, Suction available, Timeout performed and Patient being monitored Patient Re-evaluated:Patient Re-evaluated prior to inductionOxygen Delivery Method: Nasal cannula Placement Confirmation: positive ETCO2       

## 2016-07-04 NOTE — Anesthesia Postprocedure Evaluation (Signed)
Anesthesia Post Note  Patient: Kristine Perry  Procedure(s) Performed: Procedure(s) (LRB): BLEPHAROPLASTY uppper (Bilateral)  Patient location during evaluation: PACU Anesthesia Type: MAC Level of consciousness: awake and alert Pain management: pain level controlled Vital Signs Assessment: post-procedure vital signs reviewed and stable Respiratory status: spontaneous breathing, nonlabored ventilation and respiratory function stable Cardiovascular status: stable and blood pressure returned to baseline Anesthetic complications: no    Trecia Rogers

## 2016-07-04 NOTE — Op Note (Signed)
Preoperative Diagnosis:  Visually significant dermatochalasis both Upper Eyelid(s)  Postoperative Diagnosis:  Same.  Procedure(s) Performed:   Upper eyelid blepharoplasty with excess skin excision  bilateral Upper Eyelid(s)  Teaching Surgeon: Philis Pique. Vickki Muff, M.D.  Assistants: none  Anesthesia: MAC  Specimens: None.  Estimated Blood Loss: Minimal.  Complications: None.  Operative Findings: None Dictated  Procedure:   Allergies were reviewed and the patient is allergic to Meloxicam.   After the risks, benefits, complications and alternatives were discussed with the patient, appropriate informed consent was obtained and the patient was brought to the operating suite. The patient was reclined supine and a timeout was conducted.  The patient was then sedated.  Local anesthetic consisting of a 50-50 mixture of 2% lidocaine with epinephrine and 0.75% bupivacaine with added Hylenex was injected subcutaneously to both upper eyelid(s). After adequate local was instilled, the patient was prepped and draped in the usual sterile fashion for eyelid surgery.   Attention was turned to the upper eyelids. A 36mm upper eyelid crease incision line was marked with calipers on both upper eyelid(s).  A pinch test was used to estimate the amount of excess skin to remove and this was marked in standard blepharoplasty style fashion. Attention was turned to the  right upper eyelid. A #15 blade was used to open the premarked incision line. A skin and muscle flap was excised and hemostasis was obtained with bipolar cautery.   Attention was then turned to the opposite eyelid where the same procedure was performed in the same manner. Hemostasis was obtained with bipolar cautery throughout. All incisions were then closed with a combination of running and interrupted 6-0 fast absorbing plain suture. The patient tolerated the procedure well.  Erythromycin` Ophthalmic ointment was applied to her incision sites,  followed by ice packs. She was taken to the recovery area where she recovered without difficulty.  Post-Op Plan/Instructions:  The patient was instructed to use ice packs frequently for the next 48 hours. She was instructed to use erythromycin ophthalmic ointment on her incisions 4 times a day for the next 12 to 14 days. She was given a prescription for Percocet for pain control should Tylenol not be effective. She was asked to to follow up in 2 weeks' time at the [facility] in [place], [place] or sooner as needed for problems.  Clotiel Troop M. Vickki Muff, M.D. Attending,Ophthalmology

## 2016-07-04 NOTE — H&P (Signed)
  See the history and physical completed at [facility] on 06/16/16 and scanned into the chart.

## 2016-07-04 NOTE — Transfer of Care (Signed)
Immediate Anesthesia Transfer of Care Note  Patient: Kristine Perry  Procedure(s) Performed: Procedure(s): BLEPHAROPLASTY uppper (Bilateral)  Patient Location: PACU  Anesthesia Type: MAC  Level of Consciousness: awake, alert  and patient cooperative  Airway and Oxygen Therapy: Patient Spontanous Breathing and Patient connected to supplemental oxygen  Post-op Assessment: Post-op Vital signs reviewed, Patient's Cardiovascular Status Stable, Respiratory Function Stable, Patent Airway and No signs of Nausea or vomiting  Post-op Vital Signs: Reviewed and stable  Complications: No apparent anesthesia complications

## 2016-07-04 NOTE — Anesthesia Preprocedure Evaluation (Signed)
Anesthesia Evaluation  Patient identified by MRN, date of birth, ID band Patient awake    Reviewed: Allergy & Precautions, H&P , NPO status , Patient's Chart, lab work & pertinent test results, reviewed documented beta blocker date and time   Airway Mallampati: II  TM Distance: >3 FB Neck ROM: full    Dental no notable dental hx.    Pulmonary former smoker,    Pulmonary exam normal breath sounds clear to auscultation       Cardiovascular Exercise Tolerance: Good negative cardio ROS Normal cardiovascular exam Rhythm:regular Rate:Normal     Neuro/Psych  Headaches, negative psych ROS   GI/Hepatic negative GI ROS, Neg liver ROS,   Endo/Other  negative endocrine ROS  Renal/GU negative Renal ROS  negative genitourinary   Musculoskeletal   Abdominal   Peds  Hematology negative hematology ROS (+)   Anesthesia Other Findings   Reproductive/Obstetrics negative OB ROS                             Anesthesia Physical Anesthesia Plan  ASA: II  Anesthesia Plan: MAC   Post-op Pain Management:    Induction:   Airway Management Planned:   Additional Equipment:   Intra-op Plan:   Post-operative Plan:   Informed Consent: I have reviewed the patients History and Physical, chart, labs and discussed the procedure including the risks, benefits and alternatives for the proposed anesthesia with the patient or authorized representative who has indicated his/her understanding and acceptance.   Dental Advisory Given  Plan Discussed with: CRNA  Anesthesia Plan Comments:         Anesthesia Quick Evaluation

## 2016-07-04 NOTE — Interval H&P Note (Signed)
History and Physical Interval Note:  07/04/2016 11:23 AM  Kristine Perry  has presented today for surgery, with the diagnosis of H02.831 H02.834 DERMATOCHALASIS  The various methods of treatment have been discussed with the patient and family. After consideration of risks, benefits and other options for treatment, the patient has consented to  Procedure(s): BLEPHAROPLASTY (Bilateral) as a surgical intervention .  The patient's history has been reviewed, patient examined, no change in status, stable for surgery.  I have reviewed the patient's chart and labs.  Questions were answered to the patient's satisfaction.     Vickki Muff, Anaira Seay M

## 2016-07-05 ENCOUNTER — Encounter: Payer: Self-pay | Admitting: Ophthalmology

## 2016-09-25 ENCOUNTER — Other Ambulatory Visit (HOSPITAL_COMMUNITY)
Admission: RE | Admit: 2016-09-25 | Discharge: 2016-09-25 | Disposition: A | Discharge status: Home / Self Care | Payer: Medicare Other | Source: Ambulatory Visit | Attending: Family Medicine | Admitting: Family Medicine

## 2016-09-25 ENCOUNTER — Encounter: Payer: Self-pay | Admitting: Internal Medicine

## 2016-09-25 ENCOUNTER — Ambulatory Visit (INDEPENDENT_AMBULATORY_CARE_PROVIDER_SITE_OTHER): Payer: Medicare Other | Admitting: Internal Medicine

## 2016-09-25 VITALS — BP 112/80 | HR 71 | Temp 98.1°F | Ht 64.0 in | Wt 162.5 lb

## 2016-09-25 DIAGNOSIS — R7309 Other abnormal glucose: Secondary | ICD-10-CM

## 2016-09-25 DIAGNOSIS — Z1159 Encounter for screening for other viral diseases: Secondary | ICD-10-CM

## 2016-09-25 DIAGNOSIS — Z124 Encounter for screening for malignant neoplasm of cervix: Secondary | ICD-10-CM

## 2016-09-25 DIAGNOSIS — R635 Abnormal weight gain: Secondary | ICD-10-CM

## 2016-09-25 DIAGNOSIS — Z0000 Encounter for general adult medical examination without abnormal findings: Secondary | ICD-10-CM | POA: Diagnosis not present

## 2016-09-25 DIAGNOSIS — Z136 Encounter for screening for cardiovascular disorders: Secondary | ICD-10-CM | POA: Diagnosis not present

## 2016-09-25 LAB — COMPREHENSIVE METABOLIC PANEL
ALBUMIN: 4.6 g/dL (ref 3.5–5.2)
ALK PHOS: 52 U/L (ref 39–117)
ALT: 17 U/L (ref 0–35)
AST: 18 U/L (ref 0–37)
BUN: 14 mg/dL (ref 6–23)
CALCIUM: 9.5 mg/dL (ref 8.4–10.5)
CHLORIDE: 105 meq/L (ref 96–112)
CO2: 28 mEq/L (ref 19–32)
Creatinine, Ser: 0.74 mg/dL (ref 0.40–1.20)
GFR: 82.39 mL/min (ref >60.00)
Glucose, Bld: 86 mg/dL (ref 70–99)
POTASSIUM: 4 meq/L (ref 3.5–5.1)
SODIUM: 140 meq/L (ref 135–145)
TOTAL PROTEIN: 7.1 g/dL (ref 6.0–8.3)
Total Bilirubin: 0.6 mg/dL (ref 0.2–1.2)

## 2016-09-25 LAB — TSH: TSH: 1.64 u[IU]/mL (ref 0.35–4.50)

## 2016-09-25 LAB — CBC
HEMATOCRIT: 40.4 % (ref 36.0–46.0)
HEMOGLOBIN: 13.8 g/dL (ref 12.0–15.0)
MCHC: 34.2 g/dL (ref 30.0–36.0)
MCV: 89.8 fl (ref 78.0–100.0)
PLATELETS: 281 10*3/uL (ref 150.0–400.0)
RBC: 4.49 Mil/uL (ref 3.87–5.11)
RDW: 12.8 % (ref 11.5–15.5)
WBC: 5.9 10*3/uL (ref 4.0–10.5)

## 2016-09-25 LAB — LIPID PANEL
CHOLESTEROL: 239 mg/dL — AB (ref 0–200)
HDL: 64.5 mg/dL (ref >39.00)
LDL Cholesterol: 155 mg/dL — ABNORMAL HIGH (ref 0–99)
NONHDL: 174.96
TRIGLYCERIDES: 99 mg/dL (ref 0.0–149.0)
Total CHOL/HDL Ratio: 4
VLDL: 19.8 mg/dL (ref 0.0–40.0)

## 2016-09-25 LAB — HEMOGLOBIN A1C: Hgb A1c MFr Bld: 5.5 % (ref 4.6–6.5)

## 2016-09-25 NOTE — Progress Notes (Signed)
HPI:  Pt presents to the clinic today for her Medicare Wellness Exam.   Past Medical History:  Diagnosis Date  . Allergy   . Arthritis    knees, hands  . Cancer, skin, squamous cell   . Colon polyps   . Frequent headaches    sinus  . IBS (irritable bowel syndrome)   . Skin cancer, basal cell     Current Outpatient Prescriptions  Medication Sig Dispense Refill  . Ascorbic Acid (VITAMIN C) 1000 MG tablet Take 1,000 mg by mouth daily.    Marland Kitchen aspirin 81 MG tablet Take 81 mg by mouth daily.    . Aspirin-Acetaminophen-Caffeine (EXCEDRIN PO) Take 1 capsule by mouth as needed.    . Biotin 5000 MCG TABS Take 1 tablet by mouth.    . Calcium Carb-Cholecalciferol (CALCIUM 600+D3) 600-800 MG-UNIT TABS Take 1 capsule by mouth daily.    . Cholecalciferol 2000 units TABS Take by mouth daily.    Marland Kitchen erythromycin [facility]) ophthalmic ointment Use a small amount on your sutures 4 times a day for the next 2 weeks. Switch to Aquaphor ointment should allergy develop. 3.5 g 3  . loratadine (CLARITIN) 10 MG tablet Take 10 mg by mouth daily as needed.     . Multiple Vitamins-Minerals (WOMENS MULTIVITAMIN PLUS PO) Take 1 capsule by mouth daily.    . Omega-3 Fatty Acids (FISH OIL) 1000 MG CAPS Take 1 capsule by mouth daily.    Marland Kitchen oxyCODONE-acetaminophen (PERCOCET) 5-325 MG tablet Take 1 tablet by mouth every 4 (four) hours as needed for severe pain. 6 tablet 0  . Pseudoephedrine HCl (SUDAFED PO) Take by mouth daily as needed.     No current facility-administered medications for this visit.     Allergies  Allergen Reactions  . Meloxicam Nausea Only    Family History  Problem Relation Age of Onset  . Arthritis Mother   . Hyperlipidemia Mother   . Hypertension Mother   . Mental illness Sister   . Cancer Maternal Grandmother     Ovarian and Uterine  . Diabetes Maternal Grandmother   . Mental illness Maternal Grandfather   . Breast cancer Neg Hx     Social History   Social History  . Marital  status: Married    Spouse name: N/A  . Number of children: N/A  . Years of education: N/A   Occupational History  . Not on file.   Social History Main Topics  . Smoking status: Former Research scientist (life sciences)  . Smokeless tobacco: Never Used     Comment: smoked in college  . Alcohol use 0.6 oz/week    1 Glasses of wine per week     Comment: rare--wine  . Drug use: No  . Sexual activity: No   Other Topics Concern  . Not on file   Social History Narrative  . No narrative on file    Hospitiliaztions: None  Health Maintenance:    Flu: 07/2016, at [facility]  Tetanus: 2014  Prevnar: 2013   Pneumovax: 07/2013  Zostovax: 2013  Mammogram: 10/2015  Pap Smear: 08/2013  Bone Density: 09/2014  Colon Screening: 09/2011  Vision Screening: 10/2015 at [facility]  Dentist: 08/2015, Dr. Duffy Bruce    Providers:   PCP: Webb Silversmith, NP-C  Dermatologist: Dr. Kellie Moor  ENT: Dr. Richardson Landry   Dentist: Dr. Daily      I have personally reviewed and have noted:  1. The patient's medical and social history 2. Their use of alcohol, tobacco or illicit  drugs 3. Their current medications and supplements 4. The patient's functional ability including ADL's, fall risks, home safety risks and hearing or visual impairment. 5. Diet and physical activities 6. Evidence for depression or mood disorder  Subjective:   Review of Systems:   Constitutional: Pt reports weight gain. Denies fever, malaise, fatigue, headache.  HEENT: Denies eye pain, eye redness, ear pain, ringing in the ears, wax buildup, runny nose, nasal congestion, bloody nose, or sore throat. Respiratory: Denies difficulty breathing, shortness of breath, cough or sputum production.   Cardiovascular: Denies chest pain, chest tightness, palpitations or swelling in the hands or feet.  Gastrointestinal: Pt reports bloating and constipation. Denies abdominal pain, diarrhea or blood in the stool.  GU:  Denies urgency, frequency, pain with urination,  burning sensation, blood in urine, odor or discharge. Musculoskeletal: Pt reports left knee pain. Denies decrease in range of motion, difficulty with gait, muscle pain or joint swelling.  Skin: Denies redness, rashes, lesions or ulcercations.  Neurological: Denies dizziness, difficulty with memory, difficulty with speech or problems with balance and coordination.  Psych: Denies anxiety, depression, SI/HI.  No other specific complaints in a complete review of systems (except as listed in HPI above).  Objective:  PE:   BP 112/80   Pulse 71   Temp 98.1 F (36.7 C) (Oral)   Ht 5\' 4"  (1.626 m)   Wt 162 lb 8 oz (73.7 kg)   SpO2 97%   BMI 27.89 kg/m    Wt Readings from Last 3 Encounters:  07/04/16 161 lb (73 kg)  09/20/15 164 lb (74.4 kg)  03/30/15 160 lb (72.6 kg)    General: Appears her stated age, well developed, well nourished in NAD. Down 3 lbs in the last year. HEENT: Head: normal shape and size; Eyes: sclera white, no icterus, conjunctiva pink, PERRLA and EOMs intact;   Cardiovascular: Normal rate and rhythm. S1,S2 noted.  No murmur, rubs or gallops noted. No JVD or BLE edema. No carotid bruits noted. Neck: No thyromegaly noted. Pulmonary/Chest: Normal effort and positive vesicular breath sounds. No respiratory distress. No wheezes, rales or ronchi noted.  Abdomen: Soft and nontender. Hypoactive bowel sounds. Pelvic: Normal female anatomy. Cervix normal in appearrance. No signs of irritation. No CMT. Adnexa non palpable. Breast with fibrocystic changes bilaterally. Neurological: Alert and oriented. Cranial nerves II-XII grossly intact. Psychiatric: Mood and affect normal. Behavior is normal. Judgment and thought content normal.    BMET    Component Value Date/Time   NA 140 09/17/2015 0825   K 4.4 09/17/2015 0825   CL 107 09/17/2015 0825   CO2 27 09/17/2015 0825   GLUCOSE 97 09/17/2015 0825   BUN 13 09/17/2015 0825   CREATININE 0.71 09/17/2015 0825   CALCIUM 9.2  09/17/2015 0825    Lipid Panel     Component Value Date/Time   CHOL 209 (H) 09/17/2015 0825   TRIG 140.0 09/17/2015 0825   HDL 51.30 09/17/2015 0825   CHOLHDL 4 09/17/2015 0825   VLDL 28.0 09/17/2015 0825   LDLCALC 129 (H) 09/17/2015 0825    CBC    Component Value Date/Time   WBC 6.0 09/17/2015 0825   RBC 4.44 09/17/2015 0825   HGB 13.5 09/17/2015 0825   HCT 40.5 09/17/2015 0825   PLT 248.0 09/17/2015 0825   MCV 91.1 09/17/2015 0825   MCHC 33.3 09/17/2015 0825   RDW 13.3 09/17/2015 0825    Hgb A1C No results found for: HGBA1C    Assessment and Plan:  Medicare Annual Wellness Visit:  Diet: Low carb. Physical activity: She walks 4 days a week and does water aerobics a few days per week in the summer Depression/mood screen: Negative Hearing: Intact to whispered voice. 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 Visual acuity: Grossly normal, performs annual eye exam  ADLs: Capable Fall risk: None Home safety: Good Cognitive evaluation: Intact to orientation, naming, recall and repetition EOL planning: Adv directives, full code/ I agree  Preventative Medicine: Flu, tetanus and pneumovax UTD. Will request record for penumovax and prevnar. Mammogram due 10/2016- she will call to schedule. Bone density due 2020. Colonoscopy due 2022. Pap smear today. Encouraged her to consume a balanced diet and exercise regimen. Advised her to see an eye doctor and dentist annually. Will check CBC, CMET, Lipid, TSH, A1C and Hep C today.   Next appointment: 1 year  Webb Silversmith, NP

## 2016-09-25 NOTE — Patient Instructions (Signed)

## 2016-09-25 NOTE — Addendum Note (Signed)
Addended by: Lurlean Nanny on: 09/25/2016 02:16 PM   Modules accepted: Orders

## 2016-09-26 LAB — HEPATITIS C ANTIBODY: HCV AB: NEGATIVE

## 2016-09-27 LAB — CYTOLOGY - PAP: Diagnosis: NEGATIVE

## 2016-10-06 ENCOUNTER — Telehealth: Payer: Self-pay | Admitting: Internal Medicine

## 2016-10-06 NOTE — Telephone Encounter (Signed)
Patient returned Melanie's call about her lab results.

## 2016-11-21 ENCOUNTER — Telehealth: Payer: Self-pay | Admitting: Internal Medicine

## 2016-11-21 ENCOUNTER — Other Ambulatory Visit: Payer: Self-pay | Admitting: Internal Medicine

## 2016-11-21 DIAGNOSIS — Z1211 Encounter for screening for malignant neoplasm of colon: Secondary | ICD-10-CM

## 2016-11-21 DIAGNOSIS — Z1231 Encounter for screening mammogram for malignant neoplasm of breast: Secondary | ICD-10-CM

## 2016-11-21 NOTE — Telephone Encounter (Signed)
Pt called stating it is time for her to have a colonoscopy  She would like to go to [place]

## 2016-11-21 NOTE — Addendum Note (Signed)
Addended by: Jearld Fenton on: 11/21/2016 11:43 AM   Modules accepted: Orders

## 2016-11-24 ENCOUNTER — Telehealth: Payer: Self-pay | Admitting: *Deleted

## 2016-11-24 NOTE — Telephone Encounter (Signed)
PT wrote in via mychart the following message. Please advise.  Appointment Request From: Gatha Mayer    With Provider: Webb Silversmith, NP [[facility]    Preferred Date Range: Any date 11/24/2016 or later    Preferred Times: Any    Reason: To address the following health maintenance concerns.  Colonoscopy    Comments:

## 2016-11-24 NOTE — Telephone Encounter (Signed)
She has already had her Medicare Wellness Exam. I have already placed referral to GI. Please call and tell her this has been done and GI will call her in a few weeks to set up an appt. She does not need an appt here.

## 2016-11-27 ENCOUNTER — Telehealth: Payer: Self-pay | Admitting: Gastroenterology

## 2016-11-28 ENCOUNTER — Encounter: Payer: Self-pay | Admitting: Gastroenterology

## 2016-12-18 ENCOUNTER — Ambulatory Visit
Admission: RE | Admit: 2016-12-18 | Discharge: 2016-12-18 | Disposition: A | Discharge status: Home / Self Care | Payer: Medicare Other | Source: Ambulatory Visit | Attending: Internal Medicine | Admitting: Internal Medicine

## 2016-12-18 DIAGNOSIS — Z1231 Encounter for screening mammogram for malignant neoplasm of breast: Secondary | ICD-10-CM | POA: Insufficient documentation

## 2016-12-28 ENCOUNTER — Ambulatory Visit: Payer: Medicare Other | Admitting: *Deleted

## 2016-12-28 VITALS — Ht 64.0 in | Wt 166.8 lb

## 2016-12-28 DIAGNOSIS — Z86010 Personal history of colonic polyps: Secondary | ICD-10-CM

## 2016-12-28 MED ORDER — NA SULFATE-K SULFATE-MG SULF 17.5-3.13-1.6 GM/177ML PO SOLN: 1.0000 | Freq: Once | ORAL | 0 refills | Status: AC

## 2016-12-28 NOTE — Progress Notes (Signed)
Denies allergies to eggs or soy products. Denies complications with sedation or anesthesia. Denies O2 use. Denies use of diet or weight loss medications.  Emmi instructions given for colonoscopy.  

## 2017-01-08 ENCOUNTER — Encounter: Payer: Self-pay | Admitting: Gastroenterology

## 2017-01-11 ENCOUNTER — Encounter: Payer: Medicare Other | Admitting: Gastroenterology

## 2017-01-19 ENCOUNTER — Encounter: Payer: Self-pay | Admitting: Gastroenterology

## 2017-01-19 ENCOUNTER — Ambulatory Visit (AMBULATORY_SURGERY_CENTER): Payer: Medicare Other | Admitting: Gastroenterology

## 2017-01-19 VITALS — BP 123/73 | HR 71 | Temp 97.8°F | Resp 13 | Ht 64.0 in | Wt 166.0 lb

## 2017-01-19 DIAGNOSIS — D129 Benign neoplasm of anus and anal canal: Secondary | ICD-10-CM

## 2017-01-19 DIAGNOSIS — D128 Benign neoplasm of rectum: Secondary | ICD-10-CM

## 2017-01-19 DIAGNOSIS — Z86010 Personal history of colonic polyps: Secondary | ICD-10-CM

## 2017-01-19 DIAGNOSIS — D127 Benign neoplasm of rectosigmoid junction: Secondary | ICD-10-CM

## 2017-01-19 MED ORDER — SODIUM CHLORIDE 0.9 % IV SOLN: 500.0000 mL | INTRAVENOUS | Status: AC

## 2017-01-19 NOTE — Progress Notes (Signed)
Pt's states no medical or surgical changes since previsit or office visit. 

## 2017-01-19 NOTE — Progress Notes (Signed)
To recovery, report to [place], RN, VSS 

## 2017-01-19 NOTE — Op Note (Signed)
[place] Patient Name: Kristine Perry Procedure Date: 01/19/2017 3:16 PM MRN: [id number] Endoscopist: Mauri Pole , MD Age: 71 Referring MD:  Date of Birth: September 18, 1946 Gender: Female Account #: [id number] Procedure:                Colonoscopy Indications:              Surveillance: Personal history of adenomatous                            polyps on last colonoscopy > 5 years ago, High risk                            colon cancer surveillance: Personal history of                            adenoma less than 10 mm in size Medicines:                Monitored Anesthesia Care Procedure:                Pre-Anesthesia Assessment:                           - Prior to the procedure, a History and Physical                            was performed, and patient medications and                            allergies were reviewed. The patient's tolerance of                            previous anesthesia was also reviewed. The risks                            and benefits of the procedure and the sedation                            options and risks were discussed with the patient.                            All questions were answered, and informed consent                            was obtained. Prior Anticoagulants: The patient has                            taken no previous anticoagulant or antiplatelet                            agents. ASA Grade Assessment: II - A patient with                            mild systemic disease. After reviewing the risks  and benefits, the patient was deemed in                            satisfactory condition to undergo the procedure.                           After obtaining informed consent, the colonoscope                            was passed under direct vision. Throughout the                            procedure, the patient's blood pressure, pulse, and                            oxygen saturations were  monitored continuously. The                            Colonoscope was introduced through the anus and                            advanced to the the terminal ileum, with                            identification of the appendiceal orifice and IC                            valve. The colonoscopy was performed without                            difficulty. The patient tolerated the procedure                            well. The quality of the bowel preparation was                            excellent. The terminal ileum, ileocecal valve,                            appendiceal orifice, and rectum were photographed. Scope In: 3:20:09 PM Scope Out: 3:36:42 PM Scope Withdrawal Time: 0 hours 12 minutes 14 seconds  Total Procedure Duration: 0 hours 16 minutes 33 seconds  Findings:                 The perianal and digital rectal examinations were                            normal.                           A 3 mm polyp was found in the recto-sigmoid colon.                            The polyp was sessile. The polyp was removed with a  cold biopsy forceps. Resection and retrieval were                            complete.                           Scattered small and large-mouthed diverticula were                            found in the sigmoid colon and descending colon.                           Non-bleeding internal hemorrhoids were found during                            retroflexion. The hemorrhoids were small.                           The exam was otherwise without abnormality. Complications:            No immediate complications. Estimated Blood Loss:     Estimated blood loss was minimal. Impression:               - One 3 mm polyp at the recto-sigmoid colon,                            removed with a cold biopsy forceps. Resected and                            retrieved.                           - Diverticulosis in the sigmoid colon and in the                             descending colon.                           - Non-bleeding internal hemorrhoids.                           - The examination was otherwise normal. Recommendation:           - Patient has a contact number available for                            emergencies. The signs and symptoms of potential                            delayed complications were discussed with the                            patient. Return to normal activities tomorrow.                            Written discharge instructions were provided to the  patient.                           - Resume previous diet.                           - Continue present medications.                           - Await pathology results.                           - Repeat colonoscopy in 5-10 years for surveillance                            based on pathology results.                           - Return to GI clinic PRN.                           - Trial of lactose free diet for 2 weeks and                            Probiotic VSL#3 112 Billion units daily for                            excessive gas and bloating Mauri Pole, MD 01/19/2017 3:45:24 PM This report has been signed electronically.

## 2017-01-19 NOTE — Patient Instructions (Signed)
YOU HAD AN ENDOSCOPIC PROCEDURE TODAY AT [facility]:   Refer to the procedure report that was given to you for any specific questions about what was found during the examination.  If the procedure report does not answer your questions, please call your gastroenterologist to clarify.  If you requested that your care partner not be given the details of your procedure findings, then the procedure report has been included in a sealed envelope for you to review at your convenience later.  YOU SHOULD EXPECT: Some feelings of bloating in the abdomen. Passage of more gas than usual.  Walking can help get rid of the air that was put into your GI tract during the procedure and reduce the bloating. If you had a lower endoscopy (such as a colonoscopy or flexible sigmoidoscopy) you may notice spotting of blood in your stool or on the toilet paper. If you underwent a bowel prep for your procedure, you may not have a normal bowel movement for a few days.  Please Note:  You might notice some irritation and congestion in your nose or some drainage.  This is from the oxygen used during your procedure.  There is no need for concern and it should clear up in a day or so.  SYMPTOMS TO REPORT IMMEDIATELY:   Following lower endoscopy (colonoscopy or flexible sigmoidoscopy):  Excessive amounts of blood in the stool  Significant tenderness or worsening of abdominal pains  Swelling of the abdomen that is new, acute  Fever of 100F or higher    For urgent or emergent issues, a gastroenterologist can be reached at any hour by calling [phone].   DIET:  We do recommend a small meal at first, but then you may proceed to your regular diet.  Drink plenty of fluids but you should avoid alcoholic beverages for 24 hours.  ACTIVITY:  You should plan to take it easy for the rest of today and you should NOT DRIVE or use heavy machinery until tomorrow (because of the sedation medicines used during the test).     FOLLOW UP: Our staff will call the number listed on your records the next business day following your procedure to check on you and address any questions or concerns that you may have regarding the information given to you following your procedure. If we do not reach you, we will leave a message.  However, if you are feeling well and you are not experiencing any problems, there is no need to return our call.  We will assume that you have returned to your regular daily activities without incident.  If any biopsies were taken you will be contacted by phone or by letter within the next 1-3 weeks.  Please call us at [phone] if you have not heard about the biopsies in 3 weeks.    SIGNATURES/CONFIDENTIALITY: You and/or your care partner have signed paperwork which will be entered into your electronic medical record.  These signatures attest to the fact that that the information above on your After Visit Summary has been reviewed and is understood.  Full responsibility of the confidentiality of this discharge information lies with you and/or your care-partner.   INFORMATION ON POLYPS,DIVERTICULOSIS,AND HEMORRHOIDS GIVEN TO YOU TODAY  TRIAL OF LACTOSE FREE DIET FOR 2 WEEKS   PROBIOTIC VSL #3 112 BILLION UNITS DAILY FOR EXCESSIVE GAS AND BLOATING

## 2017-01-22 ENCOUNTER — Telehealth: Payer: Self-pay

## 2017-01-22 NOTE — Telephone Encounter (Signed)
  Follow up Call-  Call back number 01/19/2017  Post procedure Call Back phone  # [phone]  Permission to leave phone message Yes  Some recent data might be hidden     Patient questions:  Do you have a fever, pain , or abdominal swelling? No. Pain Score  0 *  Have you tolerated food without any problems? Yes.    Have you been able to return to your normal activities? Yes.    Do you have any questions about your discharge instructions: Diet   No. Medications  No. Follow up visit  No.  Do you have questions or concerns about your Care? No.  Actions: * If pain score is 4 or above: No action needed, pain <4.   No problems noted per pt.  She asked if the probiotic VSL was OTC, I reported to pt it was OTC.  To call us back if she has difficuity locating it. maw

## 2017-01-27 ENCOUNTER — Encounter: Payer: Self-pay | Admitting: Internal Medicine

## 2017-01-29 ENCOUNTER — Encounter: Payer: Self-pay | Admitting: Gastroenterology

## 2017-09-24 ENCOUNTER — Encounter: Payer: Medicare Other | Admitting: Internal Medicine

## 2017-09-26 ENCOUNTER — Encounter: Payer: Medicare Other | Admitting: Internal Medicine

## 2017-10-04 ENCOUNTER — Ambulatory Visit (INDEPENDENT_AMBULATORY_CARE_PROVIDER_SITE_OTHER): Payer: Medicare Other | Admitting: Internal Medicine

## 2017-10-04 ENCOUNTER — Encounter: Payer: Self-pay | Admitting: Internal Medicine

## 2017-10-04 VITALS — BP 118/80 | HR 81 | Temp 97.8°F | Ht 63.5 in | Wt 166.0 lb

## 2017-10-04 DIAGNOSIS — M150 Primary generalized (osteo)arthritis: Secondary | ICD-10-CM

## 2017-10-04 DIAGNOSIS — Z0000 Encounter for general adult medical examination without abnormal findings: Secondary | ICD-10-CM

## 2017-10-04 DIAGNOSIS — E559 Vitamin D deficiency, unspecified: Secondary | ICD-10-CM

## 2017-10-04 DIAGNOSIS — Z13220 Encounter for screening for lipoid disorders: Secondary | ICD-10-CM | POA: Diagnosis not present

## 2017-10-04 DIAGNOSIS — L853 Xerosis cutis: Secondary | ICD-10-CM

## 2017-10-04 DIAGNOSIS — K581 Irritable bowel syndrome with constipation: Secondary | ICD-10-CM

## 2017-10-04 DIAGNOSIS — M159 Polyosteoarthritis, unspecified: Secondary | ICD-10-CM

## 2017-10-04 DIAGNOSIS — R635 Abnormal weight gain: Secondary | ICD-10-CM

## 2017-10-04 DIAGNOSIS — R5383 Other fatigue: Secondary | ICD-10-CM

## 2017-10-04 LAB — CBC
HEMATOCRIT: 42.1 % (ref 36.0–46.0)
HEMOGLOBIN: 14.3 g/dL (ref 12.0–15.0)
MCHC: 34 g/dL (ref 30.0–36.0)
MCV: 90.4 fl (ref 78.0–100.0)
Platelets: 262 10*3/uL (ref 150.0–400.0)
RBC: 4.65 Mil/uL (ref 3.87–5.11)
RDW: 13.1 % (ref 11.5–15.5)
WBC: 6.3 10*3/uL (ref 4.0–10.5)

## 2017-10-04 LAB — T4, FREE: Free T4: 0.84 ng/dL (ref 0.60–1.60)

## 2017-10-04 LAB — LIPID PANEL
CHOLESTEROL: 216 mg/dL — AB (ref 0–200)
HDL: 57.4 mg/dL (ref >39.00)
LDL Cholesterol: 134 mg/dL — ABNORMAL HIGH (ref 0–99)
NonHDL: 158.95
TRIGLYCERIDES: 123 mg/dL (ref 0.0–149.0)
Total CHOL/HDL Ratio: 4
VLDL: 24.6 mg/dL (ref 0.0–40.0)

## 2017-10-04 LAB — COMPREHENSIVE METABOLIC PANEL
ALBUMIN: 4.7 g/dL (ref 3.5–5.2)
ALK PHOS: 56 U/L (ref 39–117)
ALT: 18 U/L (ref 0–35)
AST: 19 U/L (ref 0–37)
BUN: 14 mg/dL (ref 6–23)
CHLORIDE: 104 meq/L (ref 96–112)
CO2: 28 mEq/L (ref 19–32)
Calcium: 9.4 mg/dL (ref 8.4–10.5)
Creatinine, Ser: 0.73 mg/dL (ref 0.40–1.20)
GFR: 83.45 mL/min (ref >60.00)
GLUCOSE: 97 mg/dL (ref 70–99)
POTASSIUM: 4.4 meq/L (ref 3.5–5.1)
Sodium: 140 mEq/L (ref 135–145)
TOTAL PROTEIN: 6.8 g/dL (ref 6.0–8.3)
Total Bilirubin: 0.7 mg/dL (ref 0.2–1.2)

## 2017-10-04 LAB — TSH: TSH: 2.02 u[IU]/mL (ref 0.35–4.50)

## 2017-10-04 LAB — VITAMIN D 25 HYDROXY (VIT D DEFICIENCY, FRACTURES): VITD: 37.9 ng/mL (ref 30.00–100.00)

## 2017-10-04 NOTE — Patient Instructions (Signed)
Health Maintenance for Postmenopausal Women Menopause is a normal process in which your reproductive ability comes to an end. This process happens gradually over a span of months to years, usually between the ages of 22 and 9. Menopause is complete when you have missed 12 consecutive menstrual periods. It is important to talk with your health care provider about some of the most common conditions that affect postmenopausal women, such as heart disease, cancer, and bone loss (osteoporosis). Adopting a healthy lifestyle and getting preventive care can help to promote your health and wellness. Those actions can also lower your chances of developing some of these common conditions. What should I know about menopause? During menopause, you may experience a number of symptoms, such as:  Moderate-to-severe hot flashes.  Night sweats.  Decrease in sex drive.  Mood swings.  Headaches.  Tiredness.  Irritability.  Memory problems.  Insomnia.  Choosing to treat or not to treat menopausal changes is an individual decision that you make with your health care provider. What should I know about hormone replacement therapy and supplements? Hormone therapy products are effective for treating symptoms that are associated with menopause, such as hot flashes and night sweats. Hormone replacement carries certain risks, especially as you become older. If you are thinking about using estrogen or estrogen with progestin treatments, discuss the benefits and risks with your health care provider. What should I know about heart disease and stroke? Heart disease, heart attack, and stroke become more likely as you age. This may be due, in part, to the hormonal changes that your body experiences during menopause. These can affect how your body processes dietary fats, triglycerides, and cholesterol. Heart attack and stroke are both medical emergencies. There are many things that you can do to help prevent heart disease  and stroke:  Have your blood pressure checked at least every 1-2 years. High blood pressure causes heart disease and increases the risk of stroke.  If you are 53-22 years old, ask your health care provider if you should take aspirin to prevent a heart attack or a stroke.  Do not use any tobacco products, including cigarettes, chewing tobacco, or electronic cigarettes. If you need help quitting, ask your health care provider.  It is important to eat a healthy diet and maintain a healthy weight. ? Be sure to include plenty of vegetables, fruits, low-fat dairy products, and lean protein. ? Avoid eating foods that are high in solid fats, added sugars, or salt (sodium).  Get regular exercise. This is one of the most important things that you can do for your health. ? Try to exercise for at least 150 minutes each week. The type of exercise that you do should increase your heart rate and make you sweat. This is known as moderate-intensity exercise. ? Try to do strengthening exercises at least twice each week. Do these in addition to the moderate-intensity exercise.  Know your numbers.Ask your health care provider to check your cholesterol and your blood glucose. Continue to have your blood tested as directed by your health care provider.  What should I know about cancer screening? There are several types of cancer. Take the following steps to reduce your risk and to catch any cancer development as early as possible. Breast Cancer  Practice breast self-awareness. ? This means understanding how your breasts normally appear and feel. ? It also means doing regular breast self-exams. Let your health care provider know about any changes, no matter how small.  If you are 40  or older, have a clinician do a breast exam (clinical breast exam or CBE) every year. Depending on your age, family history, and medical history, it may be recommended that you also have a yearly breast X-ray (mammogram).  If you  have a family history of breast cancer, talk with your health care provider about genetic screening.  If you are at high risk for breast cancer, talk with your health care provider about having an MRI and a mammogram every year.  Breast cancer (BRCA) gene test is recommended for women who have family members with BRCA-related cancers. Results of the assessment will determine the need for genetic counseling and BRCA1 and for BRCA2 testing. BRCA-related cancers include these types: ? Breast. This occurs in males or females. ? Ovarian. ? Tubal. This may also be called fallopian tube cancer. ? Cancer of the abdominal or pelvic lining (peritoneal cancer). ? Prostate. ? Pancreatic.  Cervical, Uterine, and Ovarian Cancer Your health care provider may recommend that you be screened regularly for cancer of the pelvic organs. These include your ovaries, uterus, and vagina. This screening involves a pelvic exam, which includes checking for microscopic changes to the surface of your cervix (Pap test).  For women ages 21-65, health care providers may recommend a pelvic exam and a Pap test every three years. For women ages 79-65, they may recommend the Pap test and pelvic exam, combined with testing for human papilloma virus (HPV), every five years. Some types of HPV increase your risk of cervical cancer. Testing for HPV may also be done on women of any age who have unclear Pap test results.  Other health care providers may not recommend any screening for nonpregnant women who are considered low risk for pelvic cancer and have no symptoms. Ask your health care provider if a screening pelvic exam is right for you.  If you have had past treatment for cervical cancer or a condition that could lead to cancer, you need Pap tests and screening for cancer for at least 20 years after your treatment. If Pap tests have been discontinued for you, your risk factors (such as having a new sexual partner) need to be  reassessed to determine if you should start having screenings again. Some women have medical problems that increase the chance of getting cervical cancer. In these cases, your health care provider may recommend that you have screening and Pap tests more often.  If you have a family history of uterine cancer or ovarian cancer, talk with your health care provider about genetic screening.  If you have vaginal bleeding after reaching menopause, tell your health care provider.  There are currently no reliable tests available to screen for ovarian cancer.  Lung Cancer Lung cancer screening is recommended for adults 69-62 years old who are at high risk for lung cancer because of a history of smoking. A yearly low-dose CT scan of the lungs is recommended if you:  Currently smoke.  Have a history of at least 30 pack-years of smoking and you currently smoke or have quit within the past 15 years. A pack-year is smoking an average of one pack of cigarettes per day for one year.  Yearly screening should:  Continue until it has been 15 years since you quit.  Stop if you develop a health problem that would prevent you from having lung cancer treatment.  Colorectal Cancer  This type of cancer can be detected and can often be prevented.  Routine colorectal cancer screening usually begins at  age 42 and continues through age 45.  If you have risk factors for colon cancer, your health care provider may recommend that you be screened at an earlier age.  If you have a family history of colorectal cancer, talk with your health care provider about genetic screening.  Your health care provider may also recommend using home test kits to check for hidden blood in your stool.  A small camera at the end of a tube can be used to examine your colon directly (sigmoidoscopy or colonoscopy). This is done to check for the earliest forms of colorectal cancer.  Direct examination of the colon should be repeated every  5-10 years until age 71. However, if early forms of precancerous polyps or small growths are found or if you have a family history or genetic risk for colorectal cancer, you may need to be screened more often.  Skin Cancer  Check your skin from head to toe regularly.  Monitor any moles. Be sure to tell your health care provider: ? About any new moles or changes in moles, especially if there is a change in a mole's shape or color. ? If you have a mole that is larger than the size of a pencil eraser.  If any of your family members has a history of skin cancer, especially at a young age, talk with your health care provider about genetic screening.  Always use sunscreen. Apply sunscreen liberally and repeatedly throughout the day.  Whenever you are outside, protect yourself by wearing long sleeves, pants, a wide-brimmed hat, and sunglasses.  What should I know about osteoporosis? Osteoporosis is a condition in which bone destruction happens more quickly than new bone creation. After menopause, you may be at an increased risk for osteoporosis. To help prevent osteoporosis or the bone fractures that can happen because of osteoporosis, the following is recommended:  If you are 46-71 years old, get at least 1,000 mg of calcium and at least 600 mg of vitamin D per day.  If you are older than age 55 but younger than age 65, get at least 1,200 mg of calcium and at least 600 mg of vitamin D per day.  If you are older than age 54, get at least 1,200 mg of calcium and at least 800 mg of vitamin D per day.  Smoking and excessive alcohol intake increase the risk of osteoporosis. Eat foods that are rich in calcium and vitamin D, and do weight-bearing exercises several times each week as directed by your health care provider. What should I know about how menopause affects my mental health? Depression may occur at any age, but it is more common as you become older. Common symptoms of depression  include:  Low or sad mood.  Changes in sleep patterns.  Changes in appetite or eating patterns.  Feeling an overall lack of motivation or enjoyment of activities that you previously enjoyed.  Frequent crying spells.  Talk with your health care provider if you think that you are experiencing depression. What should I know about immunizations? It is important that you get and maintain your immunizations. These include:  Tetanus, diphtheria, and pertussis (Tdap) booster vaccine.  Influenza every year before the flu season begins.  Pneumonia vaccine.  Shingles vaccine.  Your health care provider may also recommend other immunizations. This information is not intended to replace advice given to you by your health care provider. Make sure you discuss any questions you have with your health care provider. Document Released: 12/08/2005  Document Revised: 05/05/2016 Document Reviewed: 07/20/2015 Elsevier Interactive Patient Education  2018 [employer]  

## 2017-10-04 NOTE — Progress Notes (Signed)
HPI:  Pt presents to the clinic today for her Medicare Wellness Exam. She is also due to follow up chronic conditions.  Arthritis: Mainly in her knees and hands. She will take Aleve as needed if her pain is severe, but she tries not to take anything.   IBS: She feels like her bowels have been moving normally lately. She denies constipation or abdominal cramping.   Past Medical History:  Diagnosis Date  . Allergy   . Arthritis    knees, hands  . Cancer, skin, squamous cell   . Colon polyps   . Frequent headaches    sinus  . IBS (irritable bowel syndrome)   . Skin cancer, basal cell     Current Outpatient Medications  Medication Sig Dispense Refill  . Ascorbic Acid (VITAMIN C) 1000 MG tablet Take 1,000 mg by mouth daily.    Marland Kitchen aspirin 81 MG tablet Take 81 mg by mouth daily.    . Aspirin-Acetaminophen-Caffeine (EXCEDRIN PO) Take 1 capsule by mouth as needed.    . Biotin 5000 MCG TABS Take 1 tablet by mouth.    . Calcium Carb-Cholecalciferol (CALCIUM 600+D3) 600-800 MG-UNIT TABS Take 1 capsule by mouth daily.    . Flaxseed, Linseed, (FLAX SEEDS PO) Take by mouth.    . loratadine (CLARITIN) 10 MG tablet Take 10 mg by mouth daily as needed.     . Multiple Vitamins-Minerals (WOMENS MULTIVITAMIN PLUS PO) Take 1 capsule by mouth daily.    . Omega-3 Fatty Acids (FISH OIL) 1000 MG CAPS Take 1 capsule by mouth daily.    . Pseudoephedrine HCl (SUDAFED PO) Take by mouth daily as needed.     Current Facility-Administered Medications  Medication Dose Route Frequency Provider Last Rate Last Dose  . 0.9 %  sodium chloride infusion  500 mL Intravenous Continuous Nandigam, Venia Minks, MD        Allergies  Allergen Reactions  . Meloxicam Nausea Only    Family History  Problem Relation Age of Onset  . Arthritis Mother   . Hyperlipidemia Mother   . Hypertension Mother   . Mental illness Sister   . Cancer Maternal Grandmother        Ovarian and Uterine  . Diabetes Maternal Grandmother   .  Stomach cancer Maternal Grandmother 80  . Mental illness Maternal Grandfather   . Breast cancer Neg Hx   . Colon cancer Neg Hx     Social History   Socioeconomic History  . Marital status: Married    Spouse name: Not on file  . Number of children: Not on file  . Years of education: Not on file  . Highest education level: Not on file  Social Needs  . Financial resource strain: Not on file  . Food insecurity - worry: Not on file  . Food insecurity - inability: Not on file  . Transportation needs - medical: Not on file  . Transportation needs - non-medical: Not on file  Occupational History  . Not on file  Tobacco Use  . Smoking status: Former Smoker    Last attempt to quit: 10/30/1970    Years since quitting: 46.9  . Smokeless tobacco: Never Used  . Tobacco comment: smoked in college  Substance and Sexual Activity  . Alcohol use: Yes    Alcohol/week: 0.6 oz    Types: 1 Glasses of wine per week    Comment: rare--wine  . Drug use: No  . Sexual activity: No  Other Topics Concern  .  Not on file  Social History Narrative  . Not on file    Hospitiliaztions: None None  Health Maintenance:    Flu: 07/2017  Tetanus: 11/2012  Pneumovax: 05/2012  Prevnar: 07/2013  Zostavax: 10/2011  Mammogram: 11/2016  Pap Smear: 08/2016  Bone Density: 09/2014  Colon Screening: 12/2016  Eye Doctor: yearly, [place] Exam: as needed   Providers:   PCP: Webb Silversmith, NP-C    I have personally reviewed and have noted:  1. The patient's medical and social history 2. Their use of alcohol, tobacco or illicit drugs 3. Their current medications and supplements 4. The patient's functional ability including ADL's, fall risks, home safety risks and hearing or visual impairment. 5. Diet and physical activities 6. Evidence for depression or mood disorder  Subjective:   Review of Systems:   Constitutional: Pt reports fatigue and weight gain. Denies fever, malaise, headache.   HEENT: Denies eye pain, eye redness, ear pain, ringing in the ears, wax buildup, runny nose, nasal congestion, bloody nose, or sore throat. Respiratory: Pt reports dry cough. Denies difficulty breathing, shortness of breath, cough or sputum production.   Cardiovascular: Denies chest pain, chest tightness, palpitations or swelling in the hands or feet.  Gastrointestinal: Pt reports intermittent constipation. Denies abdominal pain, bloating, diarrhea or blood in the stool.  GU: Denies urgency, frequency, pain with urination, burning sensation, blood in urine, odor or discharge. Musculoskeletal: Pt reports intermittent joint pain. Denies decrease in range of motion, difficulty with gait, muscle pain or joint swelling.  Skin: Pt reports dry skin. Denies redness, rashes, lesions or ulcercations.  Neurological: Denies dizziness, difficulty with memory, difficulty with speech or problems with balance and coordination.  Psych: Denies anxiety, depression, SI/HI.  No other specific complaints in a complete review of systems (except as listed in HPI above).  Objective:  PE:   BP 118/80   Pulse 81   Temp 97.8 F (36.6 C) (Oral)   Ht 5' 3.5" (1.613 m)   Wt 166 lb (75.3 kg)   SpO2 98%   BMI 28.94 kg/m   Wt Readings from Last 3 Encounters:  01/19/17 166 lb (75.3 kg)  12/28/16 166 lb 12.8 oz (75.7 kg)  09/25/16 162 lb 8 oz (73.7 kg)    General: Appears her stated age, in NAD. Skin: Warm, dry and intact.  HEENT: Head: normal shape and size; Eyes: sclera white, no icterus, conjunctiva pink, PERRLA and EOMs intact; Ears: Tm's gray and intact, normal light reflex; Throat/Mouth: Teeth present, mucosa pink and moist, no exudate, lesions or ulcerations noted.  Neck: Neck supple, trachea midline. No masses, lumps or thyromegaly present.  Cardiovascular: Normal rate and rhythm. S1,S2 noted.  No murmur, rubs or gallops noted. No JVD or BLE edema. No carotid bruits noted. Pulmonary/Chest: Normal effort  and positive vesicular breath sounds. No respiratory distress. No wheezes, rales or ronchi noted.  Abdomen: Soft and nontender. Normal bowel sounds. No distention or masses noted. Liver, spleen and kidneys non palpable. Musculoskeletal: Strength 5/5 BUE/BLE. No difficulty with gait. Neurological: Alert and oriented. Cranial nerves II-XII grossly intact. Coordination normal.  Psychiatric: Mood and affect normal. Behavior is normal. Judgment and thought content normal.    BMET    Component Value Date/Time   NA 140 09/25/2016 1020   K 4.0 09/25/2016 1020   CL 105 09/25/2016 1020   CO2 28 09/25/2016 1020   GLUCOSE 86 09/25/2016 1020   BUN 14 09/25/2016 1020   CREATININE 0.74 09/25/2016 1020  CALCIUM 9.5 09/25/2016 1020    Lipid Panel     Component Value Date/Time   CHOL 239 (H) 09/25/2016 1020   TRIG 99.0 09/25/2016 1020   HDL 64.50 09/25/2016 1020   CHOLHDL 4 09/25/2016 1020   VLDL 19.8 09/25/2016 1020   LDLCALC 155 (H) 09/25/2016 1020    CBC    Component Value Date/Time   WBC 5.9 09/25/2016 1020   RBC 4.49 09/25/2016 1020   HGB 13.8 09/25/2016 1020   HCT 40.4 09/25/2016 1020   PLT 281.0 09/25/2016 1020   MCV 89.8 09/25/2016 1020   MCHC 34.2 09/25/2016 1020   RDW 12.8 09/25/2016 1020    Hgb A1C Lab Results  Component Value Date   HGBA1C 5.5 09/25/2016      Assessment and Plan:   Medicare Annual Wellness Visit:  Diet: She does eat some lean meat. She consumes fruits and veggies. She avoids fried foods. She drinks mostly water. Physical activity: walking 2 miles, 3-4 days per week. Depression/mood screen: Negative Hearing: Intact to whispered voice Visual acuity: Grossly normal, performs annual eye exam  ADLs: Capable Fall risk: None Home safety: Good Cognitive evaluation: Intact to orientation, naming, recall and repetition EOL planning: Adv directives, full code/ I agree  Preventative Medicine: Flu, tetanus, prevnar, pneumovax, zostovax UTD. Mammogram,  pap smear, bone density and colon screening UTD. Encouraged her to consume a balanced diet and exercise regimen. Advised her to see an eye doctor and dentist annually. Will check CBC, CMET, Lipid, and Vit D today.  Fatigue, Weight Gain, Dry Skin:  Will check TSH, Free T4 and T3 today per patient request.  Next appointment:   Webb Silversmith, NP

## 2017-10-05 DIAGNOSIS — M1990 Unspecified osteoarthritis, unspecified site: Secondary | ICD-10-CM | POA: Insufficient documentation

## 2017-10-05 LAB — T3: T3, Total: 73 ng/dL — ABNORMAL LOW (ref 76–181)

## 2017-10-05 NOTE — Assessment & Plan Note (Signed)
Currently not much of an issues Controlled with diet Will monitor

## 2017-10-05 NOTE — Assessment & Plan Note (Signed)
Encouraged her to stay active Advised her it was okay to take Aleve as needed

## 2017-11-15 ENCOUNTER — Other Ambulatory Visit: Payer: Self-pay | Admitting: Internal Medicine

## 2017-11-15 DIAGNOSIS — Z1231 Encounter for screening mammogram for malignant neoplasm of breast: Secondary | ICD-10-CM

## 2017-12-20 ENCOUNTER — Ambulatory Visit
Admission: RE | Admit: 2017-12-20 | Discharge: 2017-12-20 | Disposition: A | Discharge status: Home / Self Care | Payer: Medicare Other | Source: Ambulatory Visit | Attending: Internal Medicine | Admitting: Internal Medicine

## 2017-12-20 DIAGNOSIS — Z1231 Encounter for screening mammogram for malignant neoplasm of breast: Secondary | ICD-10-CM | POA: Insufficient documentation

## 2018-10-07 ENCOUNTER — Encounter: Payer: Medicare Other | Admitting: Internal Medicine

## 2018-10-09 ENCOUNTER — Other Ambulatory Visit: Payer: Self-pay | Admitting: Physician Assistant

## 2018-10-09 DIAGNOSIS — Z1231 Encounter for screening mammogram for malignant neoplasm of breast: Secondary | ICD-10-CM

## 2018-12-23 ENCOUNTER — Ambulatory Visit
Admission: RE | Admit: 2018-12-23 | Discharge: 2018-12-23 | Disposition: A | Discharge status: Home / Self Care | Payer: Medicare Other | Source: Ambulatory Visit | Attending: Physician Assistant | Admitting: Physician Assistant

## 2018-12-23 DIAGNOSIS — Z1231 Encounter for screening mammogram for malignant neoplasm of breast: Secondary | ICD-10-CM

## 2019-06-18 ENCOUNTER — Other Ambulatory Visit: Payer: Self-pay

## 2019-06-18 DIAGNOSIS — Z20822 Contact with and (suspected) exposure to covid-19: Secondary | ICD-10-CM

## 2019-06-19 LAB — NOVEL CORONAVIRUS, NAA: SARS-CoV-2, NAA: NOT DETECTED

## 2019-11-20 ENCOUNTER — Ambulatory Visit: Payer: Medicare PPO | Attending: Internal Medicine

## 2019-11-20 DIAGNOSIS — Z23 Encounter for immunization: Secondary | ICD-10-CM | POA: Insufficient documentation

## 2019-11-20 NOTE — Progress Notes (Signed)
   Covid-19 Vaccination Clinic  Name:  Kristine Perry    MRN: [id number] DOB: 1945-12-03  11/20/2019  Kristine Perry was observed post Covid-19 immunization for 15 minutes without incidence. She was provided with Vaccine Information Sheet and instruction to access the V-Safe system.   Kristine Perry was instructed to call 911 with any severe reactions post vaccine: Marland Kitchen Difficulty breathing  . Swelling of your face and throat  . A fast heartbeat  . A bad rash all over your body  . Dizziness and weakness    Immunizations Administered    Name Date Dose VIS Date Route   Pfizer COVID-19 Vaccine 11/20/2019 12:52 PM 0.3 mL 10/10/2019 Intramuscular   Manufacturer: [place]   Lot: [id number]   [place]: [id number]

## 2019-12-11 ENCOUNTER — Ambulatory Visit: Payer: Medicare PPO | Attending: Internal Medicine

## 2019-12-11 DIAGNOSIS — Z23 Encounter for immunization: Secondary | ICD-10-CM | POA: Insufficient documentation

## 2019-12-11 NOTE — Progress Notes (Signed)
   Covid-19 Vaccination Clinic  Name:  Lo Paap    MRN: [id number] DOB: 1946/01/08  12/11/2019  Ms. Porras was observed post Covid-19 immunization for 15 minutes without incidence. She was provided with Vaccine Information Sheet and instruction to access the V-Safe system.   Ms. Licht was instructed to call 911 with any severe reactions post vaccine: Marland Kitchen Difficulty breathing  . Swelling of your face and throat  . A fast heartbeat  . A bad rash all over your body  . Dizziness and weakness    Immunizations Administered    Name Date Dose VIS Date Route   Pfizer COVID-19 Vaccine 12/11/2019  1:47 PM 0.3 mL 10/10/2019 Intramuscular   Manufacturer: [place]   Lot: [id number]   [place]: [id number]

## 2019-12-23 ENCOUNTER — Other Ambulatory Visit: Payer: Self-pay | Admitting: Physician Assistant

## 2019-12-23 DIAGNOSIS — Z1231 Encounter for screening mammogram for malignant neoplasm of breast: Secondary | ICD-10-CM

## 2020-02-05 ENCOUNTER — Ambulatory Visit
Admission: RE | Admit: 2020-02-05 | Discharge: 2020-02-05 | Disposition: A | Discharge status: Home / Self Care | Payer: Medicare PPO | Source: Ambulatory Visit | Attending: Physician Assistant | Admitting: Physician Assistant

## 2020-02-05 DIAGNOSIS — Z1231 Encounter for screening mammogram for malignant neoplasm of breast: Secondary | ICD-10-CM

## 2020-08-17 ENCOUNTER — Ambulatory Visit: Payer: Medicare PPO | Attending: Internal Medicine

## 2020-08-17 DIAGNOSIS — Z23 Encounter for immunization: Secondary | ICD-10-CM

## 2020-08-17 NOTE — Progress Notes (Signed)
   Covid-19 Vaccination Clinic  Name:  Kristine Perry    MRN: [id number] DOB: 1946/02/07  08/17/2020  Ms. Kristine Perry was observed post Covid-19 immunization for 15 minutes without incident. She was provided with Vaccine Information Sheet and instruction to access the V-Safe system.   Ms. Kristine Perry was instructed to call 911 with any severe reactions post vaccine: Marland Kitchen Difficulty breathing  . Swelling of face and throat  . A fast heartbeat  . A bad rash all over body  . Dizziness and weakness

## 2020-08-18 ENCOUNTER — Other Ambulatory Visit: Payer: Self-pay | Admitting: Internal Medicine

## 2020-10-28 ENCOUNTER — Ambulatory Visit
Admission: EM | Admit: 2020-10-28 | Discharge: 2020-10-28 | Disposition: A | Discharge status: Home / Self Care | Payer: Medicare PPO | Attending: Family Medicine | Admitting: Family Medicine

## 2020-10-28 DIAGNOSIS — J069 Acute upper respiratory infection, unspecified: Secondary | ICD-10-CM | POA: Diagnosis not present

## 2020-10-28 MED ORDER — BENZONATATE 100 MG PO CAPS: 100.0000 mg | ORAL_CAPSULE | Freq: Three times a day (TID) | ORAL | 0 refills | Status: AC

## 2020-10-28 MED ORDER — AZITHROMYCIN 250 MG PO TABS: 250.0000 mg | ORAL_TABLET | Freq: Every day | ORAL | 0 refills | Status: AC

## 2020-10-28 MED ORDER — FLUTICASONE PROPIONATE 50 MCG/ACT NA SUSP: 1.0000 | Freq: Every day | NASAL | 2 refills | Status: AC

## 2020-10-28 NOTE — ED Triage Notes (Signed)
Pt reports having a non productive cough with rib pain x2 days. Also reports having some chest discomfort associated with the cough. covid exposure to covid, neg home test on Monday.

## 2020-10-28 NOTE — Discharge Instructions (Signed)
Medications as prescribed Follow up as needed for continued or worsening symptoms  

## 2020-10-28 NOTE — ED Provider Notes (Signed)
Roderic Palau    CSN: [id number] Arrival date & time: 10/28/20  1137      History   Chief Complaint Chief Complaint  Patient presents with   Cough    HPI Parlee Amescua is a 74 y.o. female.   Patient is a 74 year old female who presents today with cough, chest pain, rib pain x2 days.  Symptoms have been constant.  Did have a Covid exposure.  Negative at home test on Monday.  No fevers, chills.  The pain is located to the right side.  Feels like she has mucus she cannot get up.     Past Medical History:  Diagnosis Date   Allergy    Arthritis    knees, hands   Cancer, skin, squamous cell    Colon polyps    Frequent headaches    sinus   IBS (irritable bowel syndrome)    Skin cancer, basal cell     Patient Active Problem List   Diagnosis Date Noted   Osteoarthritis 10/05/2017   Hx of skin cancer, basal cell 04/03/2014   Seasonal allergies 04/03/2014   IBS (irritable bowel syndrome) 04/03/2014    Past Surgical History:  Procedure Laterality Date   BROW LIFT Bilateral 07/04/2016   Procedure: BLEPHAROPLASTY uppper;  Surgeon: Karle Starch, MD;  Location: [place];  Service: Ophthalmology;  Laterality: Bilateral;   CATARACT EXTRACTION Bilateral    COLONOSCOPY  2012   SKIN BIOPSY     nose   TONSILLECTOMY AND ADENOIDECTOMY  1977    OB History   No obstetric history on file.      Home Medications    Prior to Admission medications   Medication Sig Start Date End Date Taking? Authorizing Provider  azithromycin (ZITHROMAX) 250 MG tablet Take 1 tablet (250 mg total) by mouth daily. Take first 2 tablets together, then 1 every day until finished. 10/28/20  Yes Edee Nifong A, NP  benzonatate (TESSALON) 100 MG capsule Take 1 capsule (100 mg total) by mouth every 8 (eight) hours. 10/28/20  Yes Caden Fukushima A, NP  fluticasone (FLONASE) 50 MCG/ACT nasal spray Place 1 spray into both nostrils daily. 10/28/20  Yes Berdina Cheever A, NP   Ascorbic Acid (VITAMIN C) 1000 MG tablet Take 1,000 mg by mouth daily.    [provider]  aspirin 81 MG tablet Take 81 mg by mouth daily.    [provider]  Aspirin-Acetaminophen-Caffeine (EXCEDRIN PO) Take 1 capsule by mouth as needed.    [provider]  Biotin 5000 MCG TABS Take 1 tablet by mouth.    [provider]  Calcium Carb-Cholecalciferol (CALCIUM 600+D3) 600-800 MG-UNIT TABS Take 1 capsule by mouth daily.    [provider]  loratadine (CLARITIN) 10 MG tablet Take 10 mg by mouth daily as needed.     [provider]  Multiple Vitamins-Minerals (WOMENS MULTIVITAMIN PLUS PO) Take 1 capsule by mouth daily.    [provider]  Omega-3 Fatty Acids (FISH OIL) 1000 MG CAPS Take 1 capsule by mouth daily.    [provider]  Pseudoephedrine HCl (SUDAFED PO) Take by mouth daily as needed.    [provider]    Family History Family History  Problem Relation Age of Onset   Arthritis Mother    Hyperlipidemia Mother    Hypertension Mother    Mental illness Sister    Cancer Maternal Grandmother        Ovarian and Uterine   Diabetes Maternal  Grandmother    Stomach cancer Maternal Grandmother 39   Mental illness Maternal Grandfather    Breast cancer Neg Hx    Colon cancer Neg Hx     Social History Social History   Tobacco Use   Smoking status: Former Smoker    Quit date: 10/30/1970    Years since quitting: 50.0   Smokeless tobacco: Never Used   Tobacco comment: smoked in college  Substance Use Topics   Alcohol use: Yes    Alcohol/week: 1.0 standard drink    Types: 1 Glasses of wine per week    Comment: rare--wine   Drug use: No     Allergies   Meloxicam   Review of Systems Review of Systems   Physical Exam Triage Vital Signs ED Triage Vitals  Enc Vitals Group     BP 10/28/20 1233 (!) 146/87     Pulse Rate 10/28/20 1233 79     Resp 10/28/20 1233 16     Temp 10/28/20  1233 98.7 F (37.1 C)     Temp Source 10/28/20 1233 Oral     SpO2 10/28/20 1233 95 %     Weight 10/28/20 1236 160 lb (72.6 kg)     Height 10/28/20 1236 5\' 4"  (1.626 m)     Head Circumference --      Peak Flow --      Pain Score 10/28/20 1236 5     Pain Loc --      Pain Edu? --      Excl. in GC? --    No data found.  Updated Vital Signs BP (!) 146/87 (BP Location: Left Arm)    Pulse 79    Temp 98.7 F (37.1 C) (Oral)    Resp 16    Ht 5\' 4"  (1.626 m)    Wt 160 lb (72.6 kg)    SpO2 95%    BMI 27.46 kg/m   Visual Acuity Right Eye Distance:   Left Eye Distance:   Bilateral Distance:    Right Eye Near:   Left Eye Near:    Bilateral Near:     Physical Exam Vitals and nursing note reviewed.  Constitutional:      General: She is not in acute distress.    Appearance: Normal appearance. She is not ill-appearing, toxic-appearing or diaphoretic.  HENT:     Head: Normocephalic.     Nose: Nose normal.  Eyes:     Conjunctiva/sclera: Conjunctivae normal.  Cardiovascular:     Rate and Rhythm: Normal rate and regular rhythm.  Pulmonary:     Effort: Pulmonary effort is normal.     Breath sounds: Rhonchi present.  Musculoskeletal:        General: Normal range of motion.     Cervical back: Normal range of motion.  Skin:    General: Skin is warm and dry.     Findings: No rash.  Neurological:     Mental Status: She is alert.  Psychiatric:        Mood and Affect: Mood normal.      UC Treatments / Results  Labs (all labs ordered are listed, but only abnormal results are displayed) Labs Reviewed  NOVEL CORONAVIRUS, NAA    EKG   Radiology No results found.  Procedures Procedures (including critical care time)  Medications Ordered in UC Medications - No data to display  Initial Impression / Assessment and Plan / UC Course  I have reviewed the triage vital signs and the nursing  notes.  Pertinent labs & imaging results that were available during my care of the patient  were reviewed by me and considered in my medical decision making (see chart for details).     URI Concern for possible bronchitis or pneumonia based on exam. We will go ahead and cover with antibiotics at this time.  Medication as prescribed.  Tessalon Perles for cough as needed.  Flonase nasal spray as needed. Follow up as needed for continued or worsening symptoms  Final Clinical Impressions(s) / UC Diagnoses   Final diagnoses:  Upper respiratory tract infection, unspecified type     Discharge Instructions     Medications as prescribed Follow up as needed for continued or worsening symptoms     ED Prescriptions    Medication Sig Dispense Auth. Provider   fluticasone (FLONASE) 50 MCG/ACT nasal spray Place 1 spray into both nostrils daily. 16 g Robbie Rideaux A, NP   azithromycin (ZITHROMAX) 250 MG tablet Take 1 tablet (250 mg total) by mouth daily. Take first 2 tablets together, then 1 every day until finished. 6 tablet Jabbar Palmero A, NP   benzonatate (TESSALON) 100 MG capsule Take 1 capsule (100 mg total) by mouth every 8 (eight) hours. 21 capsule Takahiro Godinho A, NP     PDMP not reviewed this encounter.   Janace Aris, NP 10/28/20 1303

## 2020-10-30 LAB — NOVEL CORONAVIRUS, NAA: SARS-CoV-2, NAA: NOT DETECTED

## 2020-10-30 LAB — SARS-COV-2, NAA 2 DAY TAT

## 2020-12-06 ENCOUNTER — Other Ambulatory Visit: Payer: Self-pay | Admitting: Physician Assistant

## 2020-12-06 DIAGNOSIS — Z1231 Encounter for screening mammogram for malignant neoplasm of breast: Secondary | ICD-10-CM

## 2020-12-30 ENCOUNTER — Other Ambulatory Visit: Payer: Self-pay

## 2020-12-30 ENCOUNTER — Other Ambulatory Visit
Admission: RE | Admit: 2020-12-30 | Discharge: 2020-12-30 | Disposition: A | Discharge status: Home / Self Care | Payer: Medicare PPO | Source: Ambulatory Visit | Attending: Internal Medicine | Admitting: Internal Medicine

## 2020-12-30 DIAGNOSIS — Z20822 Contact with and (suspected) exposure to covid-19: Secondary | ICD-10-CM | POA: Diagnosis not present

## 2020-12-30 DIAGNOSIS — Z01812 Encounter for preprocedural laboratory examination: Secondary | ICD-10-CM | POA: Insufficient documentation

## 2020-12-30 LAB — SARS CORONAVIRUS 2 (TAT 6-24 HRS): SARS Coronavirus 2: NEGATIVE

## 2021-01-03 ENCOUNTER — Ambulatory Visit
Admission: RE | Admit: 2021-01-03 | Discharge: 2021-01-03 | Disposition: A | Discharge status: Home / Self Care | Payer: Medicare PPO | Attending: Internal Medicine | Admitting: Internal Medicine

## 2021-01-03 ENCOUNTER — Encounter: Payer: Self-pay | Admitting: Internal Medicine

## 2021-01-03 ENCOUNTER — Other Ambulatory Visit: Payer: Self-pay

## 2021-01-03 ENCOUNTER — Ambulatory Visit: Payer: Medicare PPO | Admitting: Certified Registered"

## 2021-01-03 ENCOUNTER — Encounter
Admission: RE | Disposition: A | Discharge status: Home / Self Care | Payer: Self-pay | Source: Home / Self Care | Attending: Internal Medicine

## 2021-01-03 DIAGNOSIS — Z87891 Personal history of nicotine dependence: Secondary | ICD-10-CM | POA: Diagnosis not present

## 2021-01-03 DIAGNOSIS — Z7982 Long term (current) use of aspirin: Secondary | ICD-10-CM | POA: Insufficient documentation

## 2021-01-03 DIAGNOSIS — Z79899 Other long term (current) drug therapy: Secondary | ICD-10-CM | POA: Diagnosis not present

## 2021-01-03 DIAGNOSIS — K640 First degree hemorrhoids: Secondary | ICD-10-CM | POA: Insufficient documentation

## 2021-01-03 DIAGNOSIS — Z86010 Personal history of colonic polyps: Secondary | ICD-10-CM | POA: Diagnosis not present

## 2021-01-03 DIAGNOSIS — K5730 Diverticulosis of large intestine without perforation or abscess without bleeding: Secondary | ICD-10-CM | POA: Diagnosis not present

## 2021-01-03 DIAGNOSIS — Z1211 Encounter for screening for malignant neoplasm of colon: Secondary | ICD-10-CM | POA: Diagnosis present

## 2021-01-03 HISTORY — PX: COLONOSCOPY: SHX5424

## 2021-01-03 SURGERY — COLONOSCOPY
Anesthesia: General

## 2021-01-03 MED ORDER — PROPOFOL 500 MG/50ML IV EMUL
INTRAVENOUS | Status: AC
  Filled 2021-01-03: qty 50

## 2021-01-03 MED ORDER — PROPOFOL 10 MG/ML IV BOLUS
INTRAVENOUS | Status: DC | PRN
  Administered 2021-01-03: 80 mg via INTRAVENOUS

## 2021-01-03 MED ORDER — SODIUM CHLORIDE 0.9 % IV SOLN: INTRAVENOUS | Status: DC

## 2021-01-03 MED ORDER — PROPOFOL 500 MG/50ML IV EMUL
INTRAVENOUS | Status: DC | PRN
  Administered 2021-01-03: 130 ug/kg/min via INTRAVENOUS

## 2021-01-03 NOTE — H&P (Signed)
Outpatient short stay form Pre-procedure 01/03/2021 10:48 AM Kristine Perry K. Alice Reichert, M.D.  Primary Physician: Paulita Cradle, M.D.  Reason for visit:  Personal history of colon polyps  History of present illness:                            Patient presents for colonoscopy for a personal hx of colon polyps. The patient denies abdominal pain, abnormal weight loss or rectal bleeding.      Current Facility-Administered Medications:  .  0.9 %  sodium chloride infusion, , Intravenous, Continuous, Derl Abalos, Benay Pike, MD  Facility-Administered Medications Prior to Admission  Medication Dose Route Frequency Provider Last Rate Last Admin  . 0.9 %  sodium chloride infusion  500 mL Intravenous Continuous Nandigam, Kavitha V, MD       Medications Prior to Admission  Medication Sig Dispense Refill Last Dose  . fluticasone (FLONASE) 50 MCG/ACT nasal spray Place 1 spray into both nostrils daily. 16 g 2 Past Week at Unknown time  . loratadine (CLARITIN) 10 MG tablet Take 10 mg by mouth daily as needed.    Past Week at Unknown time  . Multiple Vitamins-Minerals (WOMENS MULTIVITAMIN PLUS PO) Take 1 capsule by mouth daily.   01/01/2021 at Unknown time  . Pseudoephedrine HCl (SUDAFED PO) Take by mouth daily as needed.   Past Week at Unknown time  . Ascorbic Acid (VITAMIN C) 1000 MG tablet Take 1,000 mg by mouth daily.   01/01/2021  . aspirin 81 MG tablet Take 81 mg by mouth daily.   01/01/2021  . Aspirin-Acetaminophen-Caffeine (EXCEDRIN PO) Take 1 capsule by mouth as needed.     Marland Kitchen azithromycin (ZITHROMAX) 250 MG tablet Take 1 tablet (250 mg total) by mouth daily. Take first 2 tablets together, then 1 every day until finished. 6 tablet 0   . benzonatate (TESSALON) 100 MG capsule Take 1 capsule (100 mg total) by mouth every 8 (eight) hours. 21 capsule 0   . Biotin 5000 MCG TABS Take 1 tablet by mouth.   01/01/2021  . Calcium Carb-Cholecalciferol (CALCIUM 600+D3) 600-800 MG-UNIT TABS Take 1 capsule by mouth daily.     .  Omega-3 Fatty Acids (FISH OIL) 1000 MG CAPS Take 1 capsule by mouth daily.   01/01/2021     Allergies  Allergen Reactions  . Meloxicam Nausea Only     Past Medical History:  Diagnosis Date  . Allergy   . Arthritis    knees, hands  . Cancer, skin, squamous cell   . Colon polyps   . Frequent headaches    sinus  . IBS (irritable bowel syndrome)   . Skin cancer, basal cell     Review of systems:  Otherwise negative.    Physical Exam  Gen: Alert, oriented. Appears stated age.  HEENT: [place]/AT. PERRLA. Lungs: CTA, no wheezes. CV: RR nl S1, S2. Abd: soft, benign, no masses. BS+ Ext: No edema. Pulses 2+    Planned procedures: Proceed with colonoscopy. The patient understands the nature of the planned procedure, indications, risks, alternatives and potential complications including but not limited to bleeding, infection, perforation, damage to internal organs and possible oversedation/side effects from anesthesia. The patient agrees and gives consent to proceed.  Please refer to procedure notes for findings, recommendations and patient disposition/instructions.     Tallis Soledad K. Alice Reichert, M.D. Gastroenterology 01/03/2021  10:48 AM

## 2021-01-03 NOTE — Anesthesia Preprocedure Evaluation (Addendum)
Anesthesia Evaluation  Patient identified by MRN, date of birth, ID band Patient awake    Reviewed: Allergy & Precautions, H&P , NPO status , Patient's Chart, lab work & pertinent test results  History of Anesthesia Complications Negative for: history of anesthetic complications  Airway Mallampati: II  TM Distance: >3 FB     Dental  (+) Teeth Intact   Pulmonary neg sleep apnea, neg COPD, former smoker,    breath sounds clear to auscultation       Cardiovascular (-) angina(-) Past MI and (-) Cardiac Stents negative cardio ROS  (-) dysrhythmias  Rhythm:regular Rate:Normal     Neuro/Psych  Headaches, negative psych ROS   GI/Hepatic negative GI ROS, Neg liver ROS,   Endo/Other  negative endocrine ROS  Renal/GU negative Renal ROS  negative genitourinary   Musculoskeletal   Abdominal   Peds  Hematology negative hematology ROS (+)   Anesthesia Other Findings Past Medical History: No date: Allergy No date: Arthritis     Comment:  knees, hands No date: Cancer, skin, squamous cell No date: Colon polyps No date: Frequent headaches     Comment:  sinus No date: IBS (irritable bowel syndrome) No date: Skin cancer, basal cell  Past Surgical History: 07/04/2016: BROW LIFT; Bilateral     Comment:  Procedure: BLEPHAROPLASTY uppper;  Surgeon: Karle Starch, MD;  Location: [place];  Service:               Ophthalmology;  Laterality: Bilateral; No date: CATARACT EXTRACTION; Bilateral 2012: COLONOSCOPY No date: SKIN BIOPSY     Comment:  nose 1977: TONSILLECTOMY AND ADENOIDECTOMY     Reproductive/Obstetrics negative OB ROS                            Anesthesia Physical Anesthesia Plan  ASA: I  Anesthesia Plan: General   Post-op Pain Management:    Induction:   PONV Risk Score and Plan: Propofol infusion and TIVA  Airway Management Planned:   Additional  Equipment:   Intra-op Plan:   Post-operative Plan:   Informed Consent: I have reviewed the patients History and Physical, chart, labs and discussed the procedure including the risks, benefits and alternatives for the proposed anesthesia with the patient or authorized representative who has indicated his/her understanding and acceptance.     Dental Advisory Given  Plan Discussed with: Anesthesiologist, CRNA and Surgeon  Anesthesia Plan Comments:         Anesthesia Quick Evaluation

## 2021-01-03 NOTE — Transfer of Care (Signed)
Immediate Anesthesia Transfer of Care Note  Patient: Kristine Perry  Procedure(s) Performed: COLONOSCOPY (N/A )  Patient Location: PACU and Endoscopy Unit  Anesthesia Type:General  Level of Consciousness: drowsy  Airway & Oxygen Therapy: Patient Spontanous Breathing  Post-op Assessment: Report given to RN  Post vital signs: stable  Last Vitals:  Vitals Value Taken Time  BP    Temp    Pulse    Resp    SpO2      Last Pain:  Vitals:   01/03/21 1101  TempSrc: Temporal  PainSc: 0-No pain         Complications: No complications documented.

## 2021-01-03 NOTE — Interval H&P Note (Signed)
History and Physical Interval Note:  01/03/2021 10:49 AM  Kristine Perry  has presented today for surgery, with the diagnosis of hx polyps.  The various methods of treatment have been discussed with the patient and family. After consideration of risks, benefits and other options for treatment, the patient has consented to  Procedure(s): COLONOSCOPY (N/A) as a surgical intervention.  The patient's history has been reviewed, patient examined, no change in status, stable for surgery.  I have reviewed the patient's chart and labs.  Questions were answered to the patient's satisfaction.     Davenport, Rudolph

## 2021-01-03 NOTE — Op Note (Signed)
[facility] Gastroenterology Patient Name: Kristine Perry Procedure Date: 01/03/2021 11:30 AM MRN: [id number] Account #: [id number] Date of Birth: [birth year] Admit Type: Outpatient Age: 75 Room: [facility] ENDO ROOM 2 Gender: Female Note Status: Finalized Procedure:             Colonoscopy Indications:           High risk colon cancer surveillance: Personal history                         of colonic polyps Providers:             Benay Pike. Alice Reichert MD, MD Referring MD:          Precious Bard, MD (Referring MD) Medicines:             Propofol per Anesthesia Complications:         No immediate complications. Procedure:             Pre-Anesthesia Assessment:                        - The risks and benefits of the procedure and the                         sedation options and risks were discussed with the                         patient. All questions were answered and informed                         consent was obtained.                        - Patient identification and proposed procedure were                         verified prior to the procedure by the nurse. The                         procedure was verified in the procedure room.                        - ASA Grade Assessment: III - A patient with severe                         systemic disease.                        - After reviewing the risks and benefits, the patient                         was deemed in satisfactory condition to undergo the                         procedure.                        After obtaining informed consent, the colonoscope was                         passed under direct vision. Throughout the  procedure,                         the patient's blood pressure, pulse, and oxygen                         saturations were monitored continuously. The                         Colonoscope was introduced through the anus and                         advanced to the the cecum, identified by  appendiceal                         orifice and ileocecal valve. The colonoscopy was                         performed without difficulty. The patient tolerated                         the procedure well. The quality of the bowel                         preparation was good. The ileocecal valve, appendiceal                         orifice, and rectum were photographed. Findings:      The perianal and digital rectal examinations were normal. Pertinent       negatives include normal sphincter tone and no palpable rectal lesions.      Non-bleeding internal hemorrhoids were found during retroflexion. The       hemorrhoids were Grade I (internal hemorrhoids that do not prolapse).      Many medium-mouthed diverticula were found in the sigmoid colon.      The exam was otherwise without abnormality. Impression:            - Non-bleeding internal hemorrhoids.                        - Diverticulosis in the sigmoid colon.                        - The examination was otherwise normal.                        - No specimens collected. Recommendation:        - Patient has a contact number available for                         emergencies. The signs and symptoms of potential                         delayed complications were discussed with the patient.                         Return to normal activities tomorrow. Written                         discharge instructions were provided  to the patient.                        - Resume previous diet.                        - Continue present medications.                        - No repeat colonoscopy due to current age (88 years                         or older) and the absence of colonic polyps.                        - You do NOT require further colon cancer screening                         measures (Annual stool testing (i.e. hemoccult, FIT,                         cologuard), sigmoidoscopy, colonoscopy or CT                         colonography). You  should share this recommendation                         with your Primary Care provider.                        - Return to GI office PRN.                        - The findings and recommendations were discussed with                         the patient. Procedure Code(s):     --- Professional ---                        E9937, Colorectal cancer screening; colonoscopy on                         individual at high risk Diagnosis Code(s):     --- Professional ---                        K57.30, Diverticulosis of large intestine without                         perforation or abscess without bleeding                        K64.0, First degree hemorrhoids                        Z86.010, Personal history of colonic polyps CPT copyright 2019 American [employer] Association. All rights reserved. The codes documented in this report are preliminary and upon coder review may  be revised to meet current compliance requirements. Efrain Sella MD, MD 01/03/2021 12:02:17 PM This report has been signed electronically. Number of Addenda: 0 Note Initiated On:  01/03/2021 11:30 AM Scope Withdrawal Time: 0 hours 6 minutes 20 seconds  Total Procedure Duration: 0 hours 10 minutes 4 seconds  Estimated Blood Loss:  Estimated blood loss: none.      [facility]

## 2021-01-04 NOTE — Anesthesia Postprocedure Evaluation (Signed)
Anesthesia Post Note  Patient: Kristine Perry  Procedure(s) Performed: COLONOSCOPY (N/A )  Patient location during evaluation: PACU Anesthesia Type: General Level of consciousness: awake and alert Pain management: pain level controlled Vital Signs Assessment: post-procedure vital signs reviewed and stable Respiratory status: spontaneous breathing, nonlabored ventilation and respiratory function stable Cardiovascular status: blood pressure returned to baseline and stable Postop Assessment: no apparent nausea or vomiting Anesthetic complications: no   No complications documented.   Last Vitals:  Vitals:   01/03/21 1101 01/03/21 1204  BP: (!) 163/77 (!) 97/54  Pulse: 86   Resp: 20   Temp: (!) 36 C (!) 35.7 C  SpO2: 99%     Last Pain:  Vitals:   01/03/21 1234  TempSrc:   PainSc: 0-No pain                 Brett Canales Indie Nickerson

## 2021-01-28 ENCOUNTER — Encounter: Payer: Self-pay | Admitting: Emergency Medicine

## 2021-01-28 ENCOUNTER — Emergency Department: Payer: Medicare PPO

## 2021-01-28 ENCOUNTER — Other Ambulatory Visit: Payer: Self-pay

## 2021-01-28 ENCOUNTER — Emergency Department
Admission: EM | Admit: 2021-01-28 | Discharge: 2021-01-28 | Disposition: A | Discharge status: Home / Self Care | Payer: Medicare PPO | Attending: Emergency Medicine | Admitting: Emergency Medicine

## 2021-01-28 DIAGNOSIS — R110 Nausea: Secondary | ICD-10-CM | POA: Insufficient documentation

## 2021-01-28 DIAGNOSIS — S01112A Laceration without foreign body of left eyelid and periocular area, initial encounter: Secondary | ICD-10-CM | POA: Diagnosis not present

## 2021-01-28 DIAGNOSIS — R55 Syncope and collapse: Secondary | ICD-10-CM

## 2021-01-28 DIAGNOSIS — M542 Cervicalgia: Secondary | ICD-10-CM | POA: Diagnosis not present

## 2021-01-28 DIAGNOSIS — Z23 Encounter for immunization: Secondary | ICD-10-CM | POA: Insufficient documentation

## 2021-01-28 DIAGNOSIS — W0110XA Fall on same level from slipping, tripping and stumbling with subsequent striking against unspecified object, initial encounter: Secondary | ICD-10-CM | POA: Diagnosis not present

## 2021-01-28 DIAGNOSIS — R42 Dizziness and giddiness: Secondary | ICD-10-CM | POA: Diagnosis not present

## 2021-01-28 DIAGNOSIS — Z85828 Personal history of other malignant neoplasm of skin: Secondary | ICD-10-CM | POA: Diagnosis not present

## 2021-01-28 DIAGNOSIS — Z87891 Personal history of nicotine dependence: Secondary | ICD-10-CM | POA: Insufficient documentation

## 2021-01-28 DIAGNOSIS — S0181XA Laceration without foreign body of other part of head, initial encounter: Secondary | ICD-10-CM

## 2021-01-28 DIAGNOSIS — Z7982 Long term (current) use of aspirin: Secondary | ICD-10-CM | POA: Insufficient documentation

## 2021-01-28 LAB — URINALYSIS, COMPLETE (UACMP) WITH MICROSCOPIC
Bacteria, UA: NONE SEEN
Bilirubin Urine: NEGATIVE
Glucose, UA: NEGATIVE mg/dL
Hgb urine dipstick: NEGATIVE
Ketones, ur: NEGATIVE mg/dL
Nitrite: NEGATIVE
Protein, ur: NEGATIVE mg/dL
Specific Gravity, Urine: 1.014 (ref 1.005–1.030)
pH: 6 (ref 5.0–8.0)

## 2021-01-28 LAB — CBC WITH DIFFERENTIAL/PLATELET
Abs Immature Granulocytes: 0.04 10*3/uL (ref 0.00–0.07)
Basophils Absolute: 0 10*3/uL (ref 0.0–0.1)
Basophils Relative: 0 %
Eosinophils Absolute: 0 10*3/uL (ref 0.0–0.5)
Eosinophils Relative: 0 %
HCT: 38.7 % (ref 36.0–46.0)
Hemoglobin: 13.2 g/dL (ref 12.0–15.0)
Immature Granulocytes: 0 %
Lymphocytes Relative: 5 %
Lymphs Abs: 0.7 10*3/uL (ref 0.7–4.0)
MCH: 29.9 pg (ref 26.0–34.0)
MCHC: 34.1 g/dL (ref 30.0–36.0)
MCV: 87.8 fL (ref 80.0–100.0)
Monocytes Absolute: 0.6 10*3/uL (ref 0.1–1.0)
Monocytes Relative: 4 %
Neutro Abs: 11.6 10*3/uL — ABNORMAL HIGH (ref 1.7–7.7)
Neutrophils Relative %: 91 %
Platelets: 216 10*3/uL (ref 150–400)
RBC: 4.41 MIL/uL (ref 3.87–5.11)
RDW: 12.8 % (ref 11.5–15.5)
WBC: 12.9 10*3/uL — ABNORMAL HIGH (ref 4.0–10.5)
nRBC: 0 % (ref 0.0–0.2)

## 2021-01-28 LAB — COMPREHENSIVE METABOLIC PANEL
ALT: 18 U/L (ref 0–44)
AST: 18 U/L (ref 15–41)
Albumin: 4 g/dL (ref 3.5–5.0)
Alkaline Phosphatase: 65 U/L (ref 38–126)
Anion gap: 7 (ref 5–15)
BUN: 20 mg/dL (ref 8–23)
CO2: 25 mmol/L (ref 22–32)
Calcium: 8.6 mg/dL — ABNORMAL LOW (ref 8.9–10.3)
Chloride: 108 mmol/L (ref 98–111)
Creatinine, Ser: 0.77 mg/dL (ref 0.44–1.00)
GFR, Estimated: >60 mL/min (ref >60)
Glucose, Bld: 143 mg/dL — ABNORMAL HIGH (ref 70–99)
Potassium: 3.8 mmol/L (ref 3.5–5.1)
Sodium: 140 mmol/L (ref 135–145)
Total Bilirubin: 0.6 mg/dL (ref 0.3–1.2)
Total Protein: 6.7 g/dL (ref 6.5–8.1)

## 2021-01-28 LAB — TROPONIN I (HIGH SENSITIVITY)
Troponin I (High Sensitivity): 3 ng/L (ref <18)
Troponin I (High Sensitivity): 3 ng/L (ref <18)

## 2021-01-28 MED ORDER — TETANUS-DIPHTH-ACELL PERTUSSIS 5-2.5-18.5 LF-MCG/0.5 IM SUSY
0.5000 mL | PREFILLED_SYRINGE | Freq: Once | INTRAMUSCULAR | Status: AC
  Administered 2021-01-28: 0.5 mL via INTRAMUSCULAR
  Filled 2021-01-28: qty 0.5

## 2021-01-28 MED ORDER — ONDANSETRON 4 MG PO TBDP
4.0000 mg | ORAL_TABLET | Freq: Once | ORAL | Status: AC
  Administered 2021-01-28: 4 mg via ORAL
  Filled 2021-01-28: qty 1

## 2021-01-28 MED ORDER — SODIUM CHLORIDE 0.9 % IV BOLUS
1000.0000 mL | Freq: Once | INTRAVENOUS | Status: AC
  Administered 2021-01-28: 1000 mL via INTRAVENOUS

## 2021-01-28 MED ORDER — ONDANSETRON HCL 4 MG/2ML IJ SOLN
4.0000 mg | Freq: Once | INTRAMUSCULAR | Status: AC
  Administered 2021-01-28: 4 mg via INTRAVENOUS
  Filled 2021-01-28: qty 2

## 2021-01-28 NOTE — ED Notes (Signed)
Patient taken to CT.

## 2021-01-28 NOTE — ED Triage Notes (Signed)
Pt to triage via w/c; st awoke to go to [facility], felt dizzy and had syncopal episode; swelling and lac noted to left eyelid; denies HA but c/o nausea; st received 4th COVID booster yesterday

## 2021-01-28 NOTE — ED Notes (Signed)
Urine specimen requested. Unable to provide at this time.

## 2021-01-28 NOTE — Discharge Instructions (Addendum)
Drink plenty of fluids daily.  Your tetanus has been updated and will be good for 10 years.  Return to the ER for worsening symptoms, persistent vomiting, difficulty breathing or other concerns.

## 2021-01-28 NOTE — ED Provider Notes (Signed)
-----------------------------------------   9:31 AM on 01/28/2021 -----------------------------------------  Patient is feeling better.  Repeat troponin remains negative.  Vitals improved with second liter of fluids.  We will discharge the patient home with PCP follow-up.  Discussed with the patient taking her time to go from lying or sitting to standing as well as increased hydration at home and rest over the next several days.   Harvest Dark, MD 01/28/21 [phone]

## 2021-01-28 NOTE — ED Notes (Signed)
Patient taken back to CT

## 2021-01-28 NOTE — ED Notes (Signed)
Up to bathroom, denies dizziness at this time.

## 2021-01-28 NOTE — ED Provider Notes (Signed)
[facility] Emergency Department Provider Note   ____________________________________________   Event Date/Time   First MD Initiated Contact with Patient 01/28/21 [phone]     (approximate)  I have reviewed the triage vital signs and the nursing notes.   HISTORY  Chief Complaint Loss of Consciousness    HPI Kristine Perry is a 75 y.o. female who presents to the ED from home with a chief complaint of syncope.  Patient received her fourth Covid shot (second booster) yesterday.  Awoke feeling nauseated, like she needed to have a bowel movement.  Went to the restroom, urinated, stood up, felt dizzy and had a brief syncopal episode, striking her forehead.  Presents with laceration and swelling above left eyelid.  Patient states similar syncopal symptoms previously when she becomes nauseated.  Denies recent fever, cough, chest pain, shortness of breath, abdominal pain, vomiting, dysuria or diarrhea.  Unknown date of last tetanus.      Past Medical History:  Diagnosis Date  . Allergy   . Arthritis    knees, hands  . Cancer, skin, squamous cell   . Colon polyps   . Frequent headaches    sinus  . IBS (irritable bowel syndrome)   . Skin cancer, basal cell     Patient Active Problem List   Diagnosis Date Noted  . Osteoarthritis 10/05/2017  . Hx of skin cancer, basal cell 04/03/2014  . Seasonal allergies 04/03/2014  . IBS (irritable bowel syndrome) 04/03/2014    Past Surgical History:  Procedure Laterality Date  . BROW LIFT Bilateral 07/04/2016   Procedure: BLEPHAROPLASTY uppper;  Surgeon: Karle Starch, MD;  Location: [facility];  Service: Ophthalmology;  Laterality: Bilateral;  . CATARACT EXTRACTION Bilateral   . COLONOSCOPY  2012  . COLONOSCOPY N/A 01/03/2021   Procedure: COLONOSCOPY;  Surgeon: Toledo, Benay Pike, MD;  Location: [facility] ENDOSCOPY;  Service: Gastroenterology;  Laterality: N/A;  . SKIN BIOPSY     nose  . TONSILLECTOMY AND  ADENOIDECTOMY  1977    Prior to Admission medications   Medication Sig Start Date End Date Taking? Authorizing Provider  Ascorbic Acid (VITAMIN C) 1000 MG tablet Take 1,000 mg by mouth daily.    [provider]  aspirin 81 MG tablet Take 81 mg by mouth daily.    [provider]  Aspirin-Acetaminophen-Caffeine (EXCEDRIN PO) Take 1 capsule by mouth as needed.    [provider]  azithromycin (ZITHROMAX) 250 MG tablet Take 1 tablet (250 mg total) by mouth daily. Take first 2 tablets together, then 1 every day until finished. 10/28/20   Loura Halt A, NP  benzonatate (TESSALON) 100 MG capsule Take 1 capsule (100 mg total) by mouth every 8 (eight) hours. 10/28/20   Loura Halt A, NP  Biotin 5000 MCG TABS Take 1 tablet by mouth.    [provider]  Calcium Carb-Cholecalciferol (CALCIUM 600+D3) 600-800 MG-UNIT TABS Take 1 capsule by mouth daily.    [provider]  fluticasone (FLONASE) 50 MCG/ACT nasal spray Place 1 spray into both nostrils daily. 10/28/20   Loura Halt A, NP  loratadine (CLARITIN) 10 MG tablet Take 10 mg by mouth daily as needed.     [provider]  Multiple Vitamins-Minerals (WOMENS MULTIVITAMIN PLUS PO) Take 1 capsule by mouth daily.    [provider]  Omega-3 Fatty Acids (FISH OIL) 1000 MG CAPS Take 1 capsule by mouth daily.    [provider]  Pseudoephedrine HCl (SUDAFED PO) Take by mouth  daily as needed.    [provider]    Allergies Meloxicam  Family History  Problem Relation Age of Onset  . Arthritis Mother   . Hyperlipidemia Mother   . Hypertension Mother   . Mental illness Sister   . Cancer Maternal Grandmother        Ovarian and Uterine  . Diabetes Maternal Grandmother   . Stomach cancer Maternal Grandmother 80  . Mental illness Maternal Grandfather   . Breast cancer Neg Hx   . Colon cancer Neg Hx     Social History Social History   Tobacco Use  . Smoking status: Former  Smoker    Quit date: 10/30/1970    Years since quitting: 50.2  . Smokeless tobacco: Never Used  . Tobacco comment: smoked in college  Vaping Use  . Vaping Use: Never used  Substance Use Topics  . Alcohol use: Yes    Alcohol/week: 1.0 standard drink    Types: 1 Glasses of wine per week    Comment: rare--wine  . Drug use: No    Review of Systems  Constitutional: No fever/chills Eyes: No visual changes. ENT: Positive for facial laceration.  No sore throat. Cardiovascular: Denies chest pain. Respiratory: Denies shortness of breath. Gastrointestinal: No abdominal pain.  Positive for nausea, no vomiting.  No diarrhea.  No constipation. Genitourinary: Negative for dysuria. Musculoskeletal: Negative for back pain. Skin: Negative for rash. Neurological: Positive for syncope.  Negative for headaches, focal weakness or numbness.   ____________________________________________   PHYSICAL EXAM:  VITAL SIGNS: ED Triage Vitals [01/28/21 0436]  Enc Vitals Group     BP      Pulse      Resp      Temp      Temp src      SpO2      Weight 160 lb (72.6 kg)     Height 5\' 4"  (1.626 m)     Head Circumference      Peak Flow      Pain Score 5     Pain Loc      Pain Edu?      Excl. in [place]?     Constitutional: Alert and oriented.  Elderly appearing and in mild acute distress. Eyes: Conjunctivae are normal. PERRL. EOMI. 1cm linear and superficial laceration above left eyebrow with small hematoma.  Bleeding controlled. Head: Atraumatic. Nose: Atraumatic. Mouth/Throat: Mucous membranes are mildly dry.  No dental malocclusion  Neck: No stridor.  No cervical spine tenderness to palpation. Cardiovascular: Normal rate, regular rhythm. Grossly normal heart sounds.  Good peripheral circulation. Respiratory: Normal respiratory effort.  No retractions. Lungs CTAB. Gastrointestinal: Soft and nontender to light or deep palpation. No distention. No abdominal bruits. No CVA tenderness. Musculoskeletal:  No lower extremity tenderness nor edema.  No joint effusions. Neurologic: Alert and oriented x3.  CN II to XII grossly intact.  Normal speech and language. No gross focal neurologic deficits are appreciated. No gait instability. Skin:  Skin is warm, dry and intact. No rash noted. Psychiatric: Mood and affect are normal. Speech and behavior are normal.  ____________________________________________   LABS (all labs ordered are listed, but only abnormal results are displayed)  Labs Reviewed  CBC WITH DIFFERENTIAL/PLATELET - Abnormal; Notable for the following components:      Result Value   WBC 12.9 (*)    Neutro Abs 11.6 (*)    All other components within normal limits  COMPREHENSIVE METABOLIC PANEL - Abnormal; Notable for the following  components:   Glucose, Bld 143 (*)    Calcium 8.6 (*)    All other components within normal limits  URINALYSIS, COMPLETE (UACMP) WITH MICROSCOPIC  TROPONIN I (HIGH SENSITIVITY)  TROPONIN I (HIGH SENSITIVITY)   ____________________________________________  EKG  ED ECG REPORT I, Oluwatomiwa Kinyon J, the attending physician, personally viewed and interpreted this ECG.   Date: 01/28/2021  EKG Time: 0555  Rate: 78  Rhythm: normal EKG, normal sinus rhythm  Axis: LAD  Intervals:none  ST&T Change: Nonspecific  ____________________________________________  RADIOLOGY I, Reighan Hipolito J, personally viewed and evaluated these images (plain radiographs) as part of my medical decision making, as well as reviewing the written report by the radiologist.  ED MD interpretation: No ICH, no cervical spine injury, no acute cardiopulmonary process  Official radiology report(s): CT Head Wo Contrast  Result Date: 01/28/2021 CLINICAL DATA:  Dizziness, syncope EXAM: CT HEAD WITHOUT CONTRAST TECHNIQUE: Contiguous axial images were obtained from the base of the skull through the vertex without intravenous contrast. COMPARISON:  None. FINDINGS: Brain: Mild parenchymal volume  loss is commensurate with the patient's age. There are scattered foci of low attenuation within the a parietal lobes bilaterally at the corticomedullary junction which are nonspecific, possibly representing remote posterior circulatory embolic infarcts or the chronic sequela of cerebral amyloid angiopathy or vasculitis. No abnormal intra or extra-axial mass lesion or fluid collection. No abnormal mass effect or midline shift. No evidence of acute intracranial hemorrhage or infarct. Ventricular size is normal. Cerebellum unremarkable. Vascular: Unremarkable Skull: Intact Sinuses/Orbits: Paranasal sinuses are clear. Orbits are unremarkable. Other: Mastoid air cells and middle ear cavities are clear. Moderate left preseptal and supraorbital soft tissue swelling. IMPRESSION: Moderate left preseptal and supraorbital soft tissue swelling. No calvarial fracture. No acute intracranial injury. Scattered bilateral parietal subcortical hypodensities, nonspecific. See differential considerations above. Electronically Signed   By: Fidela Salisbury MD   On: 01/28/2021 05:42   CT Cervical Spine Wo Contrast  Result Date: 01/28/2021 CLINICAL DATA:  Dizziness with syncope. EXAM: CT CERVICAL SPINE WITHOUT CONTRAST TECHNIQUE: Multidetector CT imaging of the cervical spine was performed without intravenous contrast. Multiplanar CT image reconstructions were also generated. COMPARISON:  None. FINDINGS: Alignment: No traumatic malalignment Skull base and vertebrae: No acute fracture Soft tissues and spinal canal: No prevertebral fluid or swelling. No visible canal hematoma. Disc levels:  Generalized disc space narrowing and ridging. Upper chest: Negative IMPRESSION: Negative for cervical spine fracture. Electronically Signed   By: Monte Fantasia M.D.   On: 01/28/2021 05:58   DG Chest Port 1 View  Result Date: 01/28/2021 CLINICAL DATA:  Syncope EXAM: PORTABLE CHEST 1 VIEW COMPARISON:  None. FINDINGS: Interstitial prominence without  Kerley lines. There is no edema, consolidation, effusion, or pneumothorax. Normal heart size and mediastinal contours for technique. IMPRESSION: Negative portable chest. Electronically Signed   By: Monte Fantasia M.D.   On: 01/28/2021 05:10    ____________________________________________   PROCEDURES  Procedure(s) performed (including Critical Care):  .1-3 Lead EKG Interpretation Performed by: Paulette Blanch, MD Authorized by: Paulette Blanch, MD     Interpretation: normal     ECG rate:  88   ECG rate assessment: normal     Rhythm: sinus rhythm     Ectopy: none     Conduction: normal   Comments:     Patient placed on cardiac monitor to evaluate for arrhythmias .Marland KitchenLaceration Repair  Date/Time: 01/28/2021 4:58 AM Performed by: Paulette Blanch, MD Authorized by: Paulette Blanch, MD  Consent:    Consent obtained:  Verbal   Consent given by:  Patient   Risks discussed:  Infection, pain, poor cosmetic result and poor wound healing Anesthesia:    Anesthesia method:  None Laceration details:    Location:  Face   Face location:  L eyebrow   Length (cm):  1   Depth (mm):  2 Exploration:    Hemostasis achieved with:  Direct pressure   Contaminated: no   Treatment:    Area cleansed with:  Saline   Amount of cleaning:  Standard   Irrigation solution:  Sterile saline   Irrigation method:  Syringe   Visualized foreign bodies/material removed: no   Skin repair:    Repair method:  Tissue adhesive and Steri-Strips Approximation:    Approximation:  Close Repair type:    Repair type:  Simple Post-procedure details:    Procedure completion:  Tolerated well, no immediate complications     ____________________________________________   INITIAL IMPRESSION / ASSESSMENT AND PLAN / ED COURSE  As part of my medical decision making, I reviewed the following data within the electronic MEDICAL RECORD NUMBER History obtained from family, Nursing notes reviewed and incorporated, Labs reviewed, EKG  interpreted, Old chart reviewed, Radiograph reviewed and Notes from prior ED visits     75 year old female presenting with syncopal episode.  Differential diagnosis includes but is not limited to CVA, ICH, vaccine side effects, infectious, metabolic etiologies, etc.  We will obtain cardiac panel, CT head, orthostatic vital signs.  Initiate IV fluid hydration, IV antiemetic and reassess.  Clinical Course as of 01/28/21 0658  Fri Jan 28, 2021  4627 Now complaining of neck pain.  Will add CT cervical spine. [JS]  0605 + orthostatics  [JS]  [id number] Updated patient and spouse of all test results.  Awaiting repeat troponin and UA.  Will order additional liter of fluids.  Care will be transferred to the oncoming provider.  Anticipate discharge home if repeat troponin unremarkable. [JS]  [id number] Care transferred to Dr. Kerman Passey at change of shift pending repeat troponin and UA results. [JS]    Clinical Course User Index [JS] Paulette Blanch, MD     ____________________________________________   FINAL CLINICAL IMPRESSION(S) / ED DIAGNOSES  Final diagnoses:  Syncope, unspecified syncope type  Facial laceration, initial encounter  Orthostatic dizziness     ED Discharge Orders    None      *Please note:  Kristine Perry was evaluated in Emergency Department on 01/28/2021 for the symptoms described in the history of present illness. She was evaluated in the context of the global COVID-19 pandemic, which necessitated consideration that the patient might be at risk for infection with the SARS-CoV-2 virus that causes COVID-19. Institutional protocols and algorithms that pertain to the evaluation of patients at risk for COVID-19 are in a state of rapid change based on information released by regulatory bodies including the CDC and federal and state organizations. These policies and algorithms were followed during the patient's care in the ED.  Some ED evaluations and interventions may be delayed as a  result of limited staffing during and the pandemic.*   Note:  This document was prepared using Dragon voice recognition software and may include unintentional dictation errors.   Paulette Blanch, MD 01/28/21 [phone]

## 2021-01-28 NOTE — ED Notes (Addendum)
Patient complaining of posterior neck pain, Dr. Beather Arbour notified.

## 2021-01-28 NOTE — ED Notes (Addendum)
Pt alert and oriented X 4, stable for discharge. RR even and unlabored, color WNL. Discussed discharge instructions and follow-up as directed. Discharge medications discussed if prescribed. Pt had opportunity to ask questions, and RN to provide patient/family eduction. Ambulates safely. Tolerates PO fluids.

## 2021-02-16 ENCOUNTER — Other Ambulatory Visit: Payer: Self-pay

## 2021-02-16 ENCOUNTER — Ambulatory Visit
Admission: RE | Admit: 2021-02-16 | Discharge: 2021-02-16 | Disposition: A | Discharge status: Home / Self Care | Payer: Medicare PPO | Source: Ambulatory Visit | Attending: Physician Assistant | Admitting: Physician Assistant

## 2021-02-16 DIAGNOSIS — Z1231 Encounter for screening mammogram for malignant neoplasm of breast: Secondary | ICD-10-CM | POA: Insufficient documentation

## 2021-03-23 ENCOUNTER — Other Ambulatory Visit: Payer: Self-pay | Admitting: Physician Assistant

## 2021-03-23 DIAGNOSIS — R55 Syncope and collapse: Secondary | ICD-10-CM

## 2021-03-23 DIAGNOSIS — R930 Abnormal findings on diagnostic imaging of skull and head, not elsewhere classified: Secondary | ICD-10-CM

## 2021-03-29 ENCOUNTER — Ambulatory Visit: Payer: Medicare PPO

## 2021-04-18 ENCOUNTER — Ambulatory Visit
Admission: RE | Admit: 2021-04-18 | Discharge: 2021-04-18 | Disposition: A | Discharge status: Home / Self Care | Payer: Medicare PPO | Source: Ambulatory Visit | Attending: Physician Assistant | Admitting: Physician Assistant

## 2021-04-18 ENCOUNTER — Other Ambulatory Visit: Payer: Self-pay

## 2021-04-18 DIAGNOSIS — R55 Syncope and collapse: Secondary | ICD-10-CM

## 2021-04-18 DIAGNOSIS — R930 Abnormal findings on diagnostic imaging of skull and head, not elsewhere classified: Secondary | ICD-10-CM

## 2021-04-18 MED ORDER — GADOBUTROL 1 MMOL/ML IV SOLN
7.0000 mL | Freq: Once | INTRAVENOUS | Status: AC | PRN
  Administered 2021-04-18: 7 mL via INTRAVENOUS

## 2021-09-05 IMAGING — CT CT HEAD W/O CM
3 series · 15 of 47 positions shown, 18 images · non-contrast
Comparison: None.

CLINICAL DATA: Dizziness, syncope

EXAM:
CT HEAD WITHOUT CONTRAST
TECHNIQUE: Contiguous axial images were obtained from the base of the skull
through the vertex without intravenous contrast.

[Series 3: head wo · axial · 0.43mm/px · z∈[-127,-2]mm · 9 of 30 slices shown, 12 images]
[im 3/30  brain]
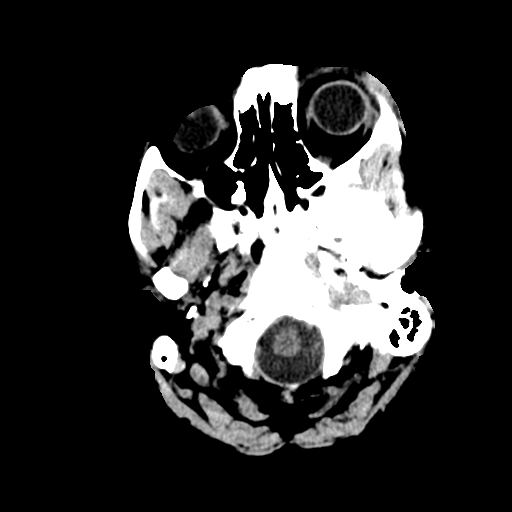
[im 3/30  bone]
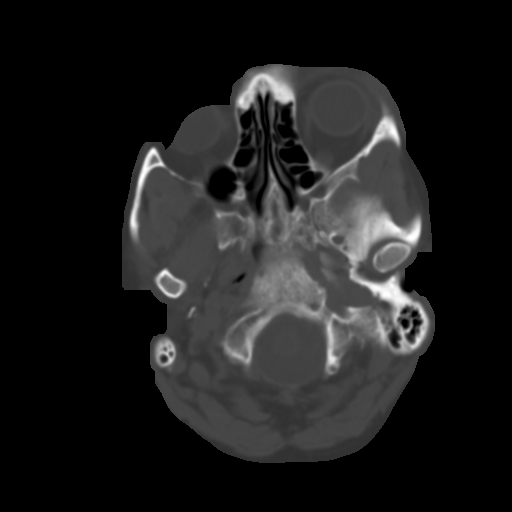
[im 6/30  brain]
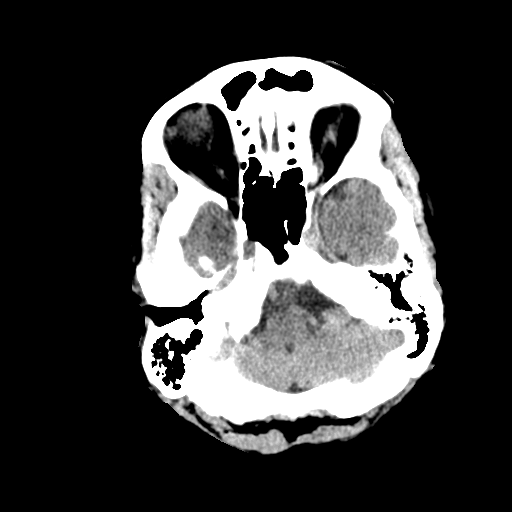
[im 9/30  brain]
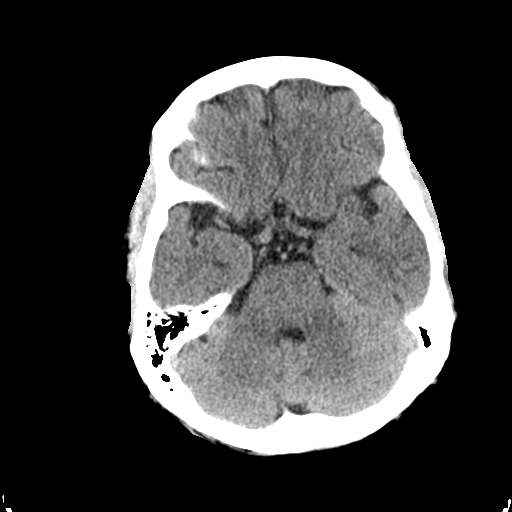
[im 12/30  brain]
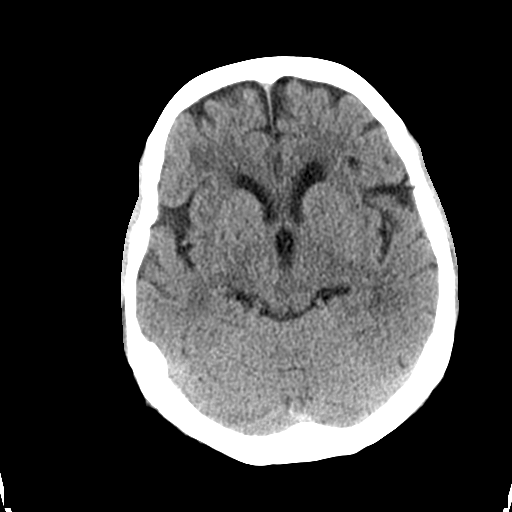
[im 16/30  brain]
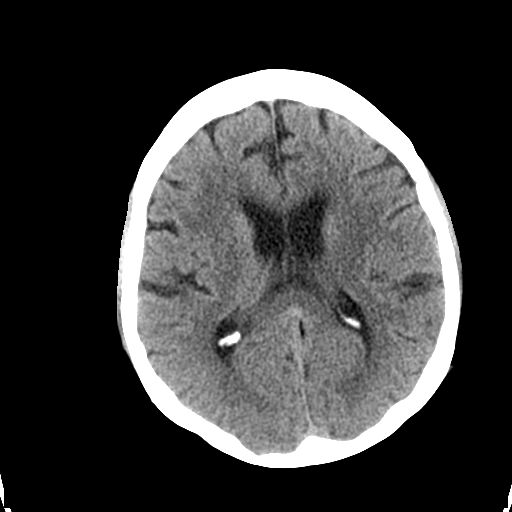
[im 16/30  bone]
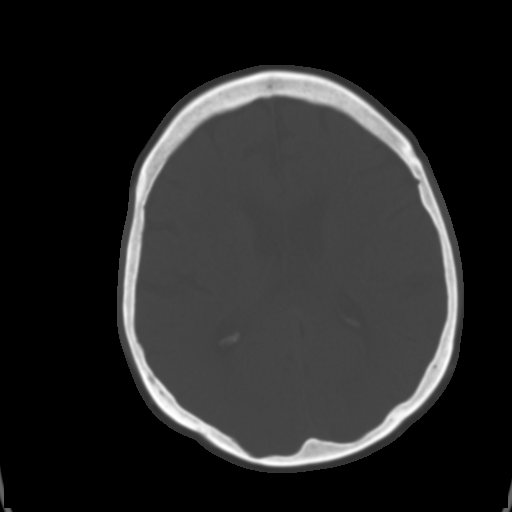
[im 19/30  brain]
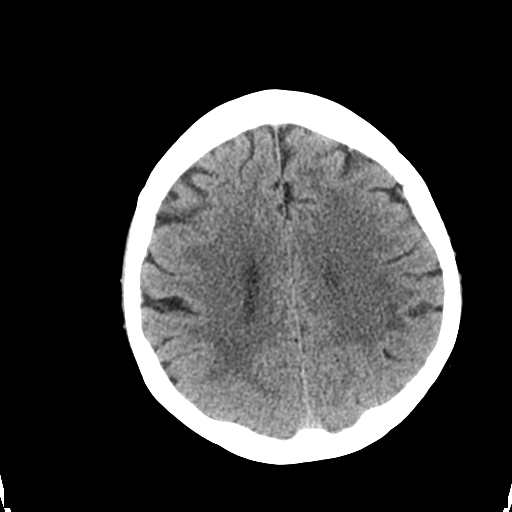
[im 22/30  brain]
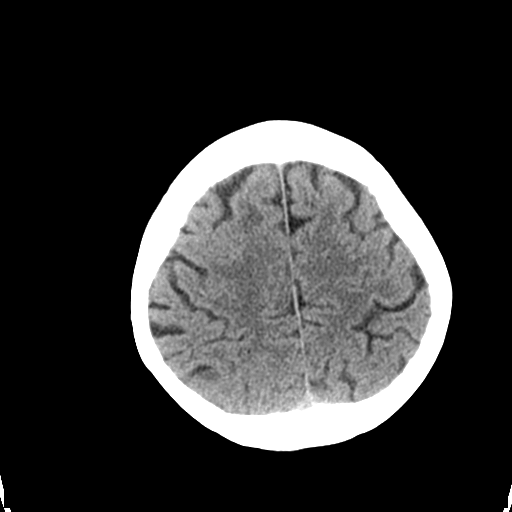
[im 25/30  brain]
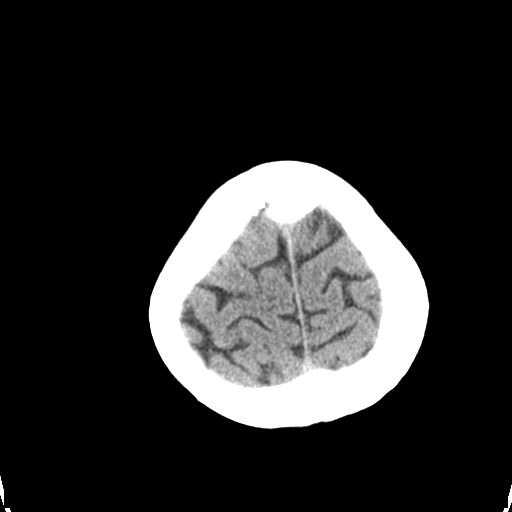
[im 28/30  brain]
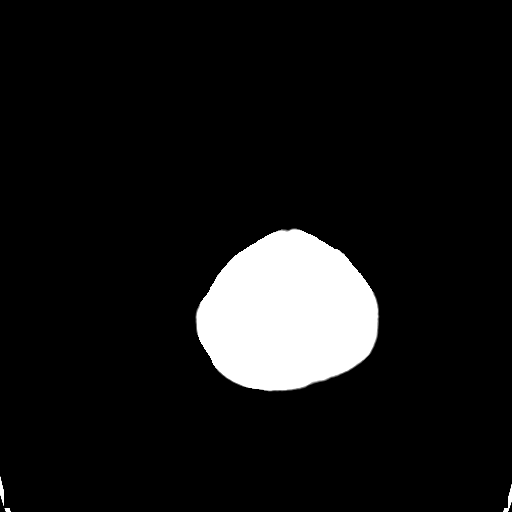
[im 28/30  bone]
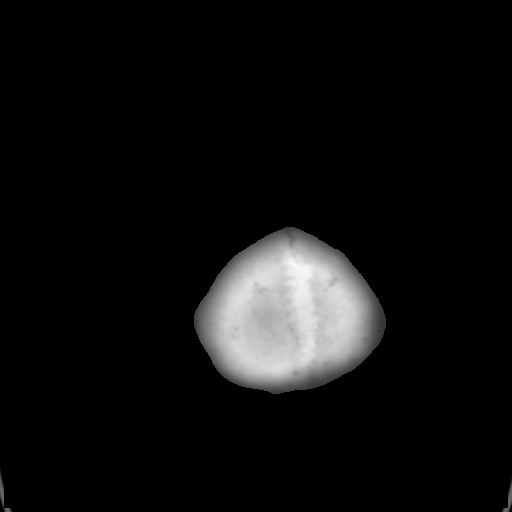

[Series 4: coronal soft tissue · coronal · 0.30mm/px · 3 of 63 slices shown]
[im 21/63  brain]
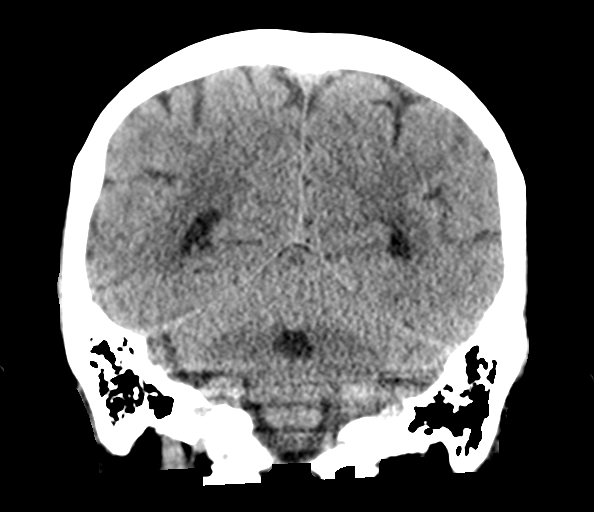
[im 28/63  brain]
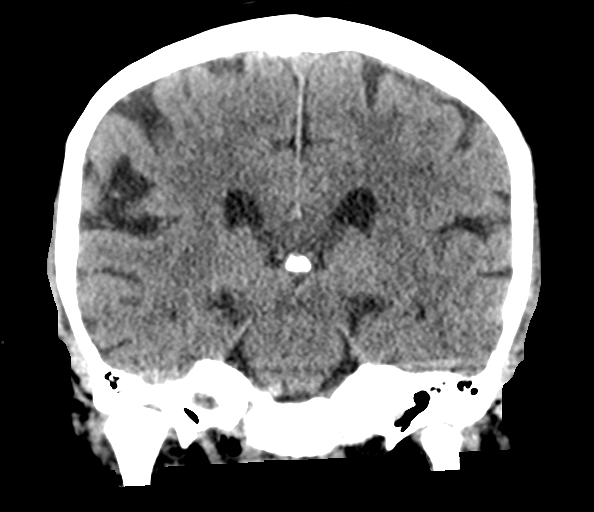
[im 35/63  brain]
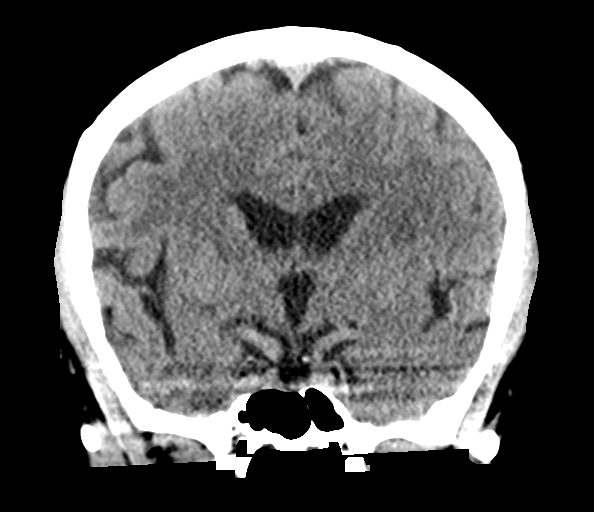

[Series 5: sagittal soft tissue · sagittal · 0.31mm/px · 3 of 61 slices shown]
[im 21/61  brain]
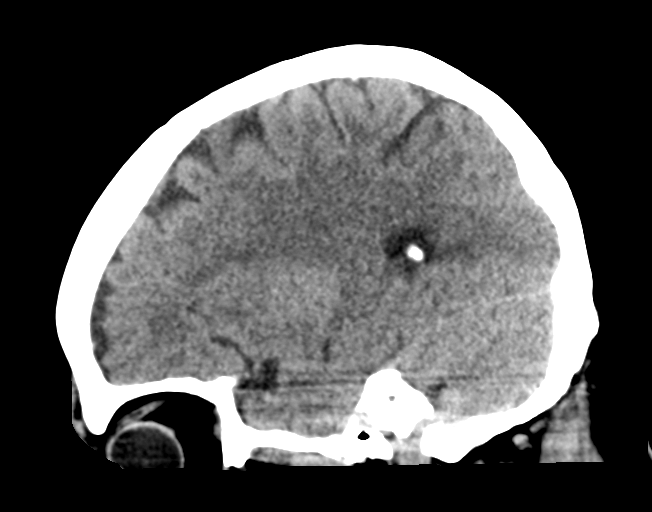
[im 31/61  brain]
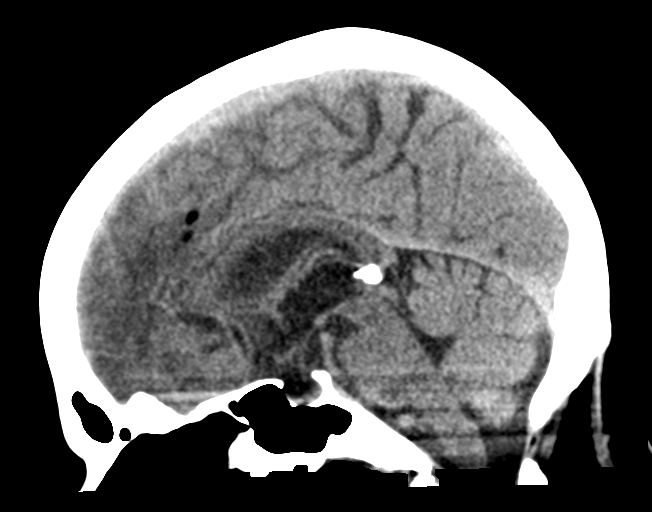
[im 41/61  brain]
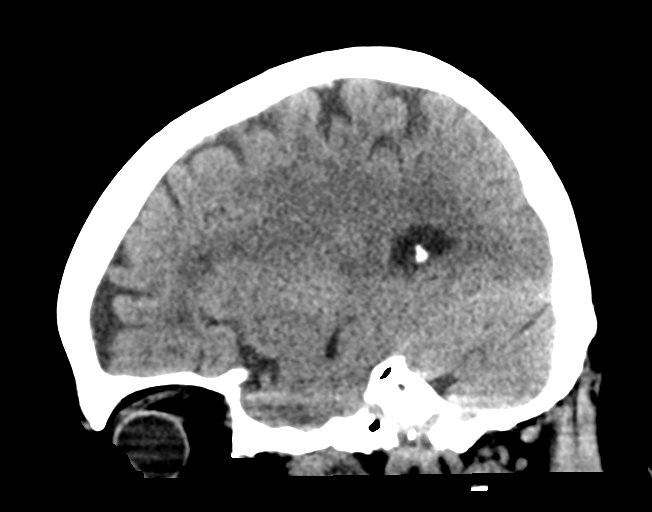

[15 of 47 positions shown; findings below may reference images not displayed]

FINDINGS: Brain: Mild parenchymal volume loss is commensurate with the
patient's age. There are scattered foci of low attenuation within
the a parietal lobes bilaterally at the corticomedullary junction
which are nonspecific, possibly representing remote posterior
circulatory embolic infarcts or the chronic sequela of cerebral
amyloid angiopathy or vasculitis. No abnormal intra or extra-axial
mass lesion or fluid collection. No abnormal mass effect or midline
shift. No evidence of acute intracranial hemorrhage or infarct.
Ventricular size is normal. Cerebellum unremarkable.

Vascular: Unremarkable

Skull: Intact

Sinuses/Orbits: Paranasal sinuses are clear. Orbits are
unremarkable.

Other: Mastoid air cells and middle ear cavities are clear. Moderate
left preseptal and supraorbital soft tissue swelling.
IMPRESSION: Moderate left preseptal and supraorbital soft tissue swelling. No
calvarial fracture. No acute intracranial injury.

Scattered bilateral parietal subcortical hypodensities, nonspecific.
See differential considerations above.

## 2021-09-05 IMAGING — CT CT CERVICAL SPINE W/O CM
3 of 4 series · 12 of 33 positions shown, 14 images · non-contrast
Comparison: None.

CLINICAL DATA: Dizziness with syncope.

EXAM:
CT CERVICAL SPINE WITHOUT CONTRAST
TECHNIQUE: Multidetector CT imaging of the cervical spine was performed without
intravenous contrast. Multiplanar CT image reconstructions were also
generated.

[Series 4: sagittal bone · sagittal · 0.26mm/px · 5 of 71 slices shown, 6 images]
[im 24/71  bone]
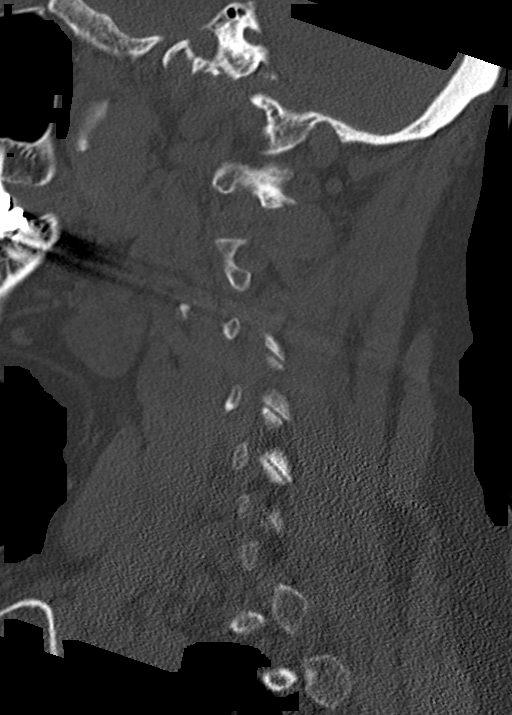
[im 30/71  bone]
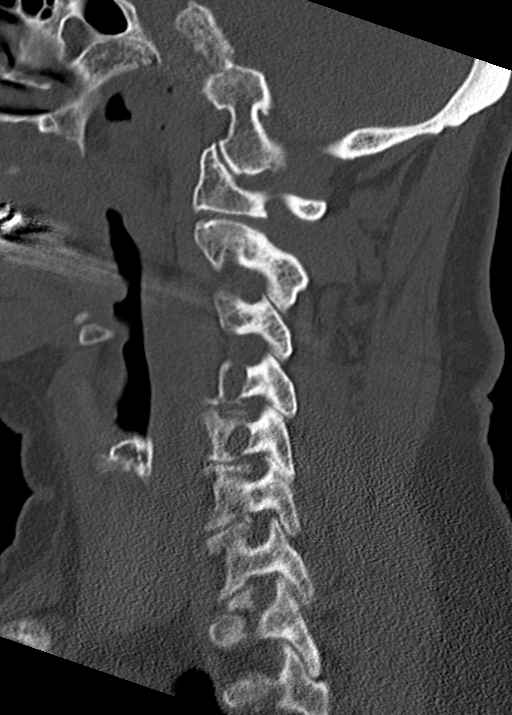
[im 36/71  soft-tissue]
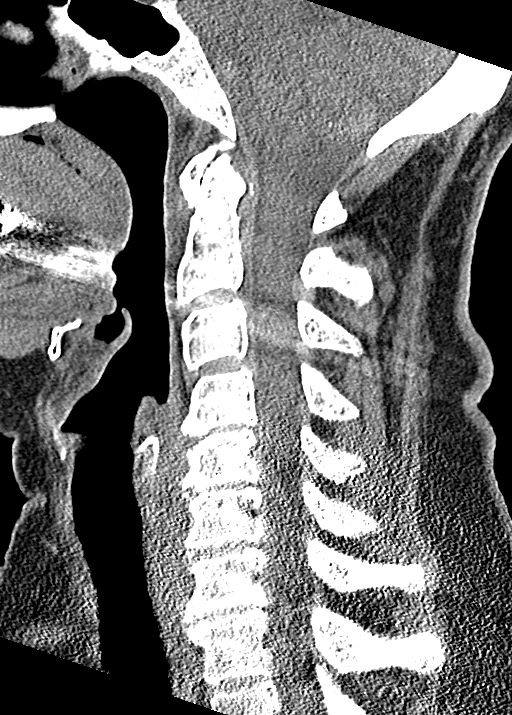
[im 36/71  bone]
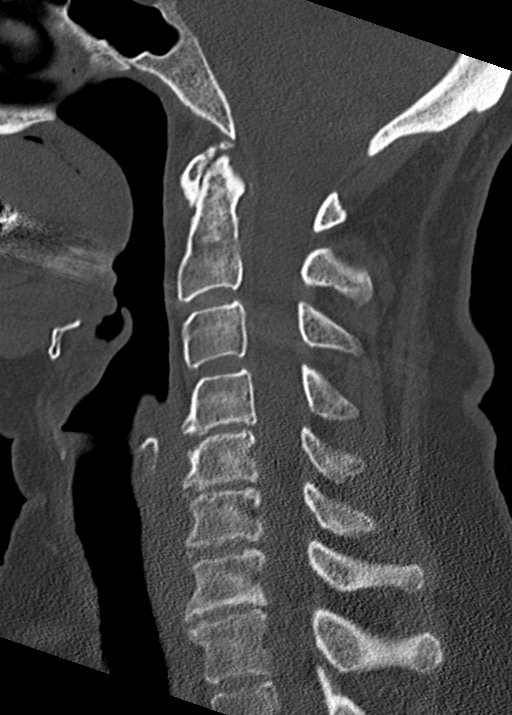
[im 41/71  bone]
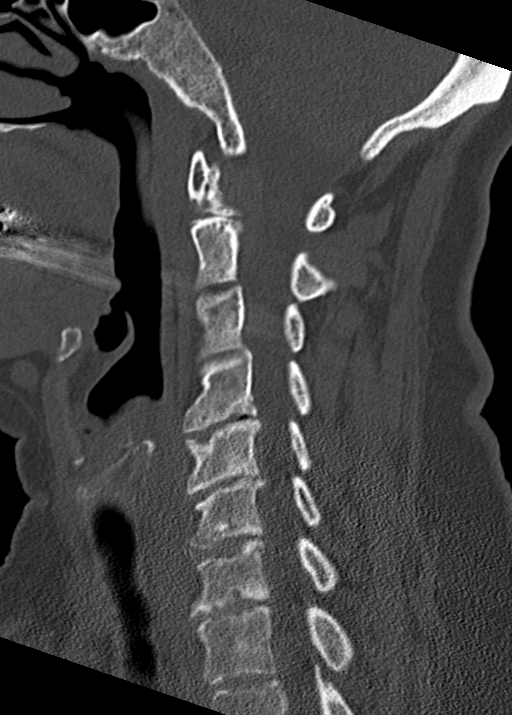
[im 47/71  bone]
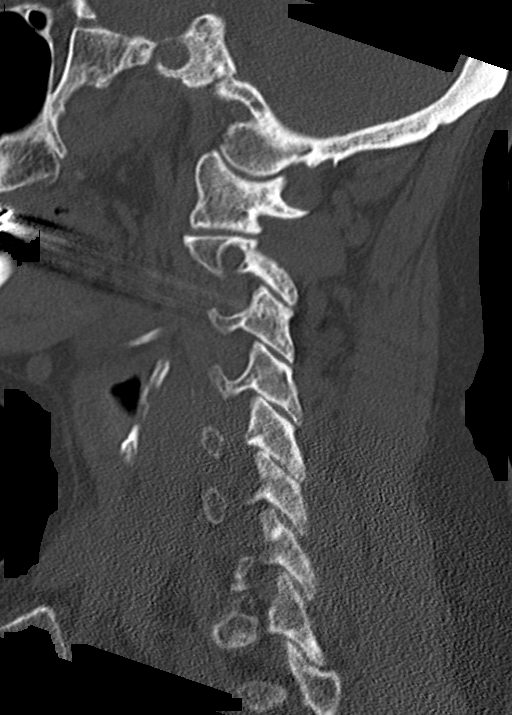

[Series 5: coronal bone · coronal · 0.28mm/px · 3 of 61 slices shown]
[im 13/61  bone]
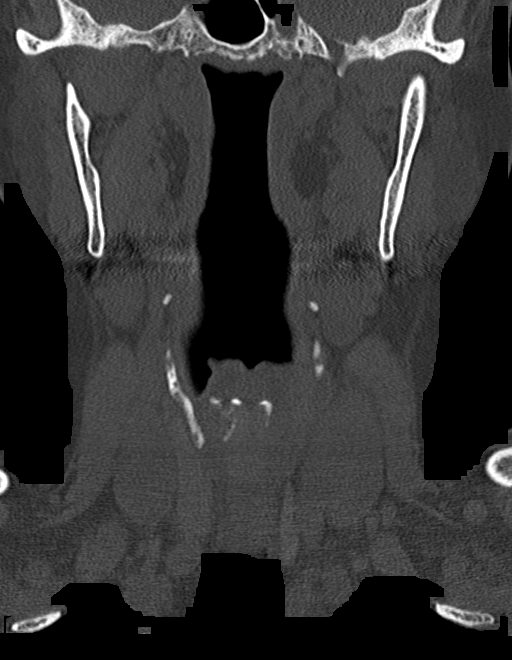
[im 25/61  bone]
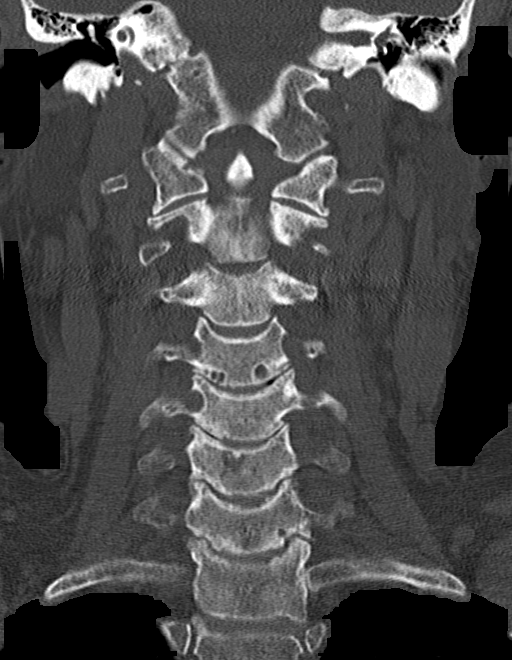
[im 37/61  bone]
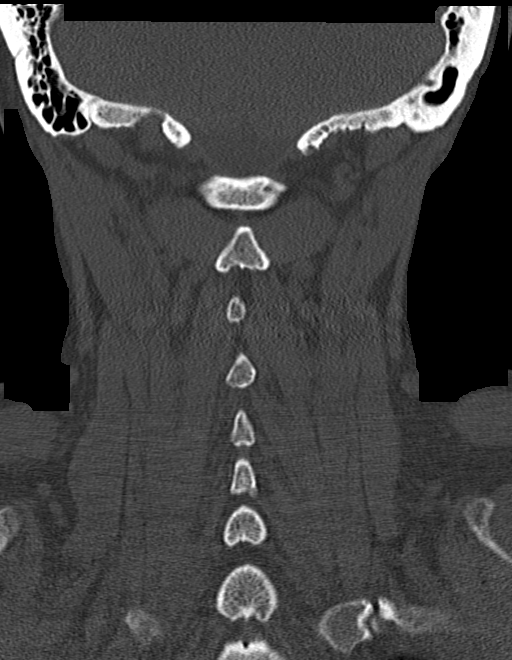

[Series 6: orthogonal bone · axial · 0.22mm/px · z∈[-201,-87]mm · 4 of 92 slices shown, 5 images]
[im 16/92  soft-tissue]
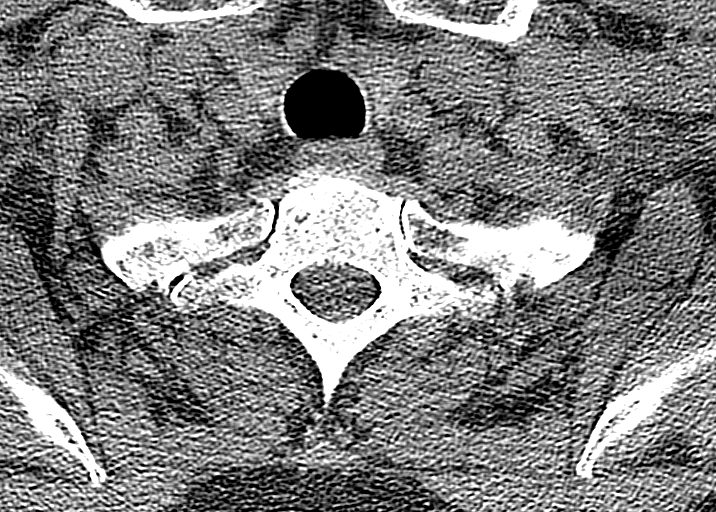
[im 16/92  bone]
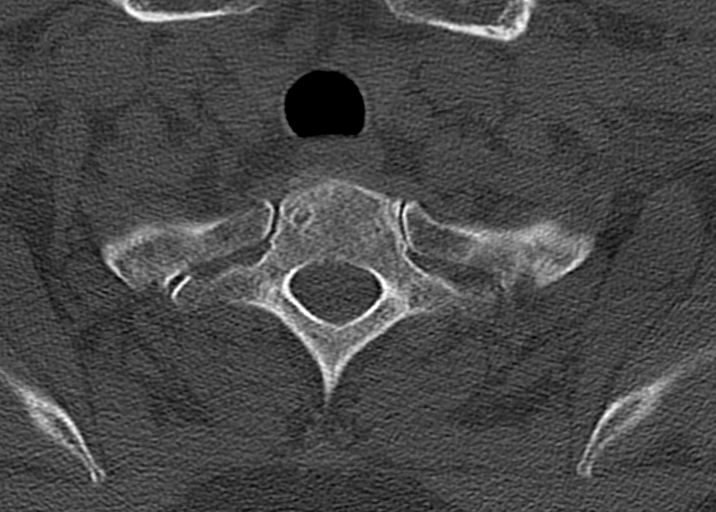
[im 31/92  bone]
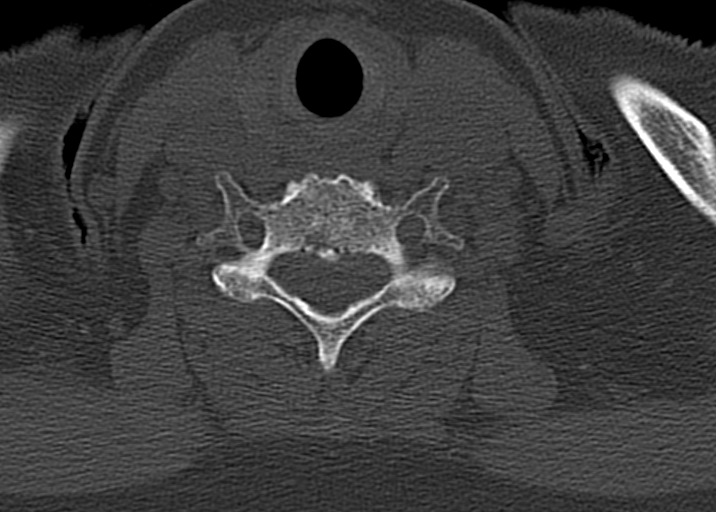
[im 61/92  bone]
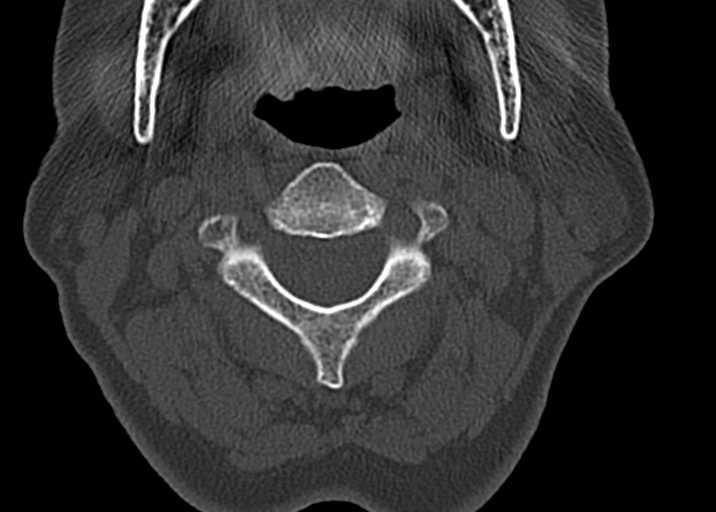
[im 76/92  bone]
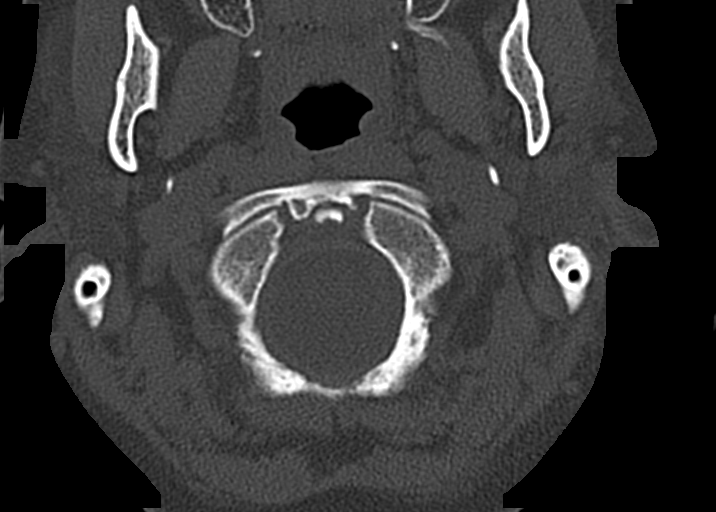

[12 of 33 positions shown; findings below may reference images not displayed]

FINDINGS: Alignment: No traumatic malalignment

Skull base and vertebrae: No acute fracture

Soft tissues and spinal canal: No prevertebral fluid or swelling. No
visible canal hematoma.

Disc levels:  Generalized disc space narrowing and ridging.

Upper chest: Negative
IMPRESSION: Negative for cervical spine fracture.

## 2022-01-06 ENCOUNTER — Other Ambulatory Visit: Payer: Self-pay | Admitting: Physician Assistant

## 2022-01-06 DIAGNOSIS — Z1231 Encounter for screening mammogram for malignant neoplasm of breast: Secondary | ICD-10-CM

## 2022-01-12 ENCOUNTER — Other Ambulatory Visit: Payer: Self-pay | Admitting: Gastroenterology

## 2022-02-17 ENCOUNTER — Ambulatory Visit
Admission: RE | Admit: 2022-02-17 | Discharge: 2022-02-17 | Disposition: A | Discharge status: Home / Self Care | Payer: Medicare PPO | Source: Ambulatory Visit | Attending: Physician Assistant | Admitting: Physician Assistant

## 2022-02-17 DIAGNOSIS — Z1231 Encounter for screening mammogram for malignant neoplasm of breast: Secondary | ICD-10-CM | POA: Diagnosis present

## 2022-10-25 ENCOUNTER — Other Ambulatory Visit: Payer: Self-pay | Admitting: Physical Medicine & Rehabilitation

## 2022-10-25 DIAGNOSIS — M5441 Lumbago with sciatica, right side: Secondary | ICD-10-CM

## 2022-10-28 ENCOUNTER — Ambulatory Visit
Admission: RE | Admit: 2022-10-28 | Discharge: 2022-10-28 | Disposition: A | Discharge status: Home / Self Care | Payer: Medicare PPO | Source: Ambulatory Visit | Attending: Physical Medicine & Rehabilitation | Admitting: Physical Medicine & Rehabilitation

## 2022-10-28 DIAGNOSIS — M5441 Lumbago with sciatica, right side: Secondary | ICD-10-CM | POA: Insufficient documentation

## 2022-10-28 DIAGNOSIS — M5442 Lumbago with sciatica, left side: Secondary | ICD-10-CM | POA: Insufficient documentation

## 2022-10-28 MED ORDER — GADOBUTROL 1 MMOL/ML IV SOLN
7.0000 mL | Freq: Once | INTRAVENOUS | Status: AC | PRN
  Administered 2022-10-28: 7.5 mL via INTRAVENOUS

## 2023-01-16 ENCOUNTER — Other Ambulatory Visit: Payer: Self-pay | Admitting: Physician Assistant

## 2023-01-16 DIAGNOSIS — Z1231 Encounter for screening mammogram for malignant neoplasm of breast: Secondary | ICD-10-CM

## 2023-02-20 ENCOUNTER — Ambulatory Visit
Admission: RE | Admit: 2023-02-20 | Discharge: 2023-02-20 | Disposition: A | Discharge status: Home / Self Care | Payer: Medicare PPO | Source: Ambulatory Visit | Attending: Physician Assistant | Admitting: Physician Assistant

## 2023-02-20 DIAGNOSIS — Z1231 Encounter for screening mammogram for malignant neoplasm of breast: Secondary | ICD-10-CM

## 2024-01-07 ENCOUNTER — Other Ambulatory Visit: Payer: Self-pay | Admitting: Physician Assistant

## 2024-01-07 DIAGNOSIS — Z1231 Encounter for screening mammogram for malignant neoplasm of breast: Secondary | ICD-10-CM

## 2024-02-22 ENCOUNTER — Ambulatory Visit
Admission: RE | Admit: 2024-02-22 | Discharge: 2024-02-22 | Disposition: A | Discharge status: Home / Self Care | Source: Ambulatory Visit | Attending: Physician Assistant | Admitting: Physician Assistant

## 2024-02-22 DIAGNOSIS — Z1231 Encounter for screening mammogram for malignant neoplasm of breast: Secondary | ICD-10-CM | POA: Insufficient documentation

## 2024-10-21 ENCOUNTER — Other Ambulatory Visit: Payer: Self-pay | Admitting: Physician Assistant

## 2024-10-21 DIAGNOSIS — Z1231 Encounter for screening mammogram for malignant neoplasm of breast: Secondary | ICD-10-CM

## 2024-11-12 ENCOUNTER — Encounter: Payer: Self-pay | Admitting: Physician Assistant

## 2024-11-12 ENCOUNTER — Other Ambulatory Visit: Payer: Self-pay | Admitting: Physician Assistant

## 2024-11-12 DIAGNOSIS — M79621 Pain in right upper arm: Secondary | ICD-10-CM

## 2024-11-18 ENCOUNTER — Ambulatory Visit
Admission: RE | Admit: 2024-11-18 | Discharge: 2024-11-18 | Disposition: A | Source: Ambulatory Visit | Attending: Physician Assistant | Admitting: Physician Assistant

## 2024-11-18 ENCOUNTER — Ambulatory Visit
Admission: RE | Admit: 2024-11-18 | Discharge: 2024-11-18 | Disposition: A | Discharge status: Home / Self Care | Source: Ambulatory Visit | Attending: Physician Assistant | Admitting: Physician Assistant

## 2024-11-18 DIAGNOSIS — M79621 Pain in right upper arm: Secondary | ICD-10-CM

## 2024-11-18 DIAGNOSIS — N644 Mastodynia: Secondary | ICD-10-CM | POA: Diagnosis not present
